# Patient Record
Sex: Female | Born: 1940 | Race: White | Hispanic: No | Marital: Married | Smoking: Never smoker
Health system: Southern US, Community
[De-identification: ages and names within clinical notes are randomized; demographics above are authoritative.]

---

## 1998-05-20 ENCOUNTER — Other Ambulatory Visit: Admission: RE | Admit: 1998-05-20 | Discharge: 1998-05-20 | Payer: Self-pay | Admitting: Obstetrics and Gynecology

## 2002-01-24 ENCOUNTER — Other Ambulatory Visit: Admission: RE | Admit: 2002-01-24 | Discharge: 2002-01-24 | Payer: Self-pay | Admitting: Obstetrics and Gynecology

## 2003-04-12 ENCOUNTER — Other Ambulatory Visit: Admission: RE | Admit: 2003-04-12 | Discharge: 2003-04-12 | Payer: Self-pay | Admitting: Obstetrics and Gynecology

## 2006-08-04 ENCOUNTER — Emergency Department (HOSPITAL_COMMUNITY): Admission: EM | Admit: 2006-08-04 | Discharge: 2006-08-04 | Payer: Self-pay | Admitting: Emergency Medicine

## 2006-08-16 ENCOUNTER — Encounter: Admission: RE | Admit: 2006-08-16 | Discharge: 2006-11-14 | Payer: Self-pay | Admitting: Orthopedic Surgery

## 2006-11-15 ENCOUNTER — Encounter: Admission: RE | Admit: 2006-11-15 | Discharge: 2006-12-01 | Payer: Self-pay | Admitting: Orthopedic Surgery

## 2012-05-11 ENCOUNTER — Encounter (HOSPITAL_BASED_OUTPATIENT_CLINIC_OR_DEPARTMENT_OTHER): Payer: Self-pay | Admitting: *Deleted

## 2012-05-11 ENCOUNTER — Emergency Department (HOSPITAL_BASED_OUTPATIENT_CLINIC_OR_DEPARTMENT_OTHER)
Admission: EM | Admit: 2012-05-11 | Discharge: 2012-05-11 | Disposition: A | Discharge status: Home / Self Care | Payer: Medicare Other | Attending: Emergency Medicine | Admitting: Emergency Medicine

## 2012-05-11 ENCOUNTER — Emergency Department (HOSPITAL_BASED_OUTPATIENT_CLINIC_OR_DEPARTMENT_OTHER): Payer: Medicare Other

## 2012-05-11 DIAGNOSIS — W010XXA Fall on same level from slipping, tripping and stumbling without subsequent striking against object, initial encounter: Secondary | ICD-10-CM | POA: Insufficient documentation

## 2012-05-11 DIAGNOSIS — S0181XA Laceration without foreign body of other part of head, initial encounter: Secondary | ICD-10-CM

## 2012-05-11 DIAGNOSIS — S5001XA Contusion of right elbow, initial encounter: Secondary | ICD-10-CM

## 2012-05-11 DIAGNOSIS — S5000XA Contusion of unspecified elbow, initial encounter: Secondary | ICD-10-CM | POA: Insufficient documentation

## 2012-05-11 DIAGNOSIS — S0180XA Unspecified open wound of other part of head, initial encounter: Secondary | ICD-10-CM | POA: Insufficient documentation

## 2012-05-11 MED ORDER — HYDROCODONE-ACETAMINOPHEN 5-325 MG PO TABS: 2.0000 | ORAL_TABLET | ORAL | Status: AC | PRN

## 2012-05-11 NOTE — ED Provider Notes (Signed)
Medical screening examination/treatment/procedure(s) were performed by non-physician practitioner and as supervising physician I was immediately available for consultation/collaboration.   Forbes Cellar, MD 05/11/12 2352

## 2012-05-11 NOTE — ED Notes (Signed)
Hit the corner of a table with her head. No loc. Laceration noted to the right side of her temple. Bleeding controlled.

## 2012-05-11 NOTE — ED Provider Notes (Signed)
History     CSN: [id number]  Arrival date & time 05/11/12  2055   None     Chief Complaint  Patient presents with  . Head Laceration    (Consider location/radiation/quality/duration/timing/severity/associated sxs/prior treatment) Patient is a 71 y.o. female presenting with fall. The history is provided by the patient. No language interpreter was used.  Fall The accident occurred less than 1 hour ago. The fall occurred while standing (pt tripped on a door step and fell hitting her head on a table). She fell from a height of 3 to 5 ft. She landed on a hard floor. The volume of blood lost was minimal. The point of impact was the head and right elbow. The pain is moderate. She was ambulatory at the scene. There was no entrapment after the fall. There was no drug use involved in the accident. There was no alcohol use involved in the accident. She has tried nothing for the symptoms.  Pt denies neck or back pain.  Pt has a cut to the right side of her face along hailine  History reviewed. No pertinent past medical history.  History reviewed. No pertinent past surgical history.  No family history on file.  History  Substance Use Topics  . Smoking status: Never Smoker   . Smokeless tobacco: Not on file  . Alcohol Use: No    OB History    Grav Para Term Preterm Abortions TAB SAB Ect Mult Living                  Review of Systems  All other systems reviewed and are negative.    Allergies  Review of patient's allergies indicates not on file.  Home Medications  No current outpatient prescriptions on file.  BP 143/84  Pulse 87  Temp 98.1 F (36.7 C) (Oral)  Resp 20  SpO2 98%  Physical Exam  Vitals reviewed. Constitutional: She is oriented to person, place, and time. She appears well-developed and well-nourished.  HENT:  Head: Normocephalic.       3 cm laceration right lateral forehead  Eyes: EOM are normal. Pupils are equal, round, and reactive to light.  Neck: Normal  range of motion. Neck supple.  Cardiovascular: Normal rate.   Pulmonary/Chest: Effort normal.  Abdominal: Soft.  Musculoskeletal: Normal range of motion.  Neurological: She is alert and oriented to person, place, and time. She has normal reflexes. No cranial nerve deficit. Coordination normal.  Skin:       Laceration right face  Psychiatric: She has a normal mood and affect.    ED Course  LACERATION REPAIR Date/Time: 05/11/2012 10:03 PM Performed by: Cheron Schaumann K Authorized by: Cheron Schaumann K Risks and benefits: risks, benefits and alternatives were discussed Consent given by: patient Patient identity confirmed: verbally with patient Body area: head/neck Laceration length: 3 cm Foreign bodies: no foreign bodies Tendon involvement: none Nerve involvement: none Anesthesia: local infiltration Local anesthetic: lidocaine 2% without epinephrine Patient sedated: no Preparation: Patient was prepped and draped in the usual sterile fashion. Irrigation solution: saline Amount of cleaning: standard Debridement: none Skin closure: 6-0 Prolene Subcutaneous closure: 5-0 Vicryl Number of sutures: 10 Technique: simple Approximation: close Approximation difficulty: complex Patient tolerance: Patient tolerated the procedure well with no immediate complications.   (including critical care time)  Labs Reviewed - No data to display No results found.   1. Laceration of forehead   2. Contusion of right elbow       MDM  rx for hydrocodone Pt advised suture removal in 7 days      Lonia Skinner [place], [place] 05/11/12 2231

## 2013-01-12 ENCOUNTER — Emergency Department (HOSPITAL_BASED_OUTPATIENT_CLINIC_OR_DEPARTMENT_OTHER)
Admission: EM | Admit: 2013-01-12 | Discharge: 2013-01-12 | Disposition: A | Discharge status: Home / Self Care | Payer: Medicare Other | Attending: Emergency Medicine | Admitting: Emergency Medicine

## 2013-01-12 ENCOUNTER — Encounter (HOSPITAL_BASED_OUTPATIENT_CLINIC_OR_DEPARTMENT_OTHER): Payer: Self-pay | Admitting: *Deleted

## 2013-01-12 ENCOUNTER — Emergency Department (HOSPITAL_BASED_OUTPATIENT_CLINIC_OR_DEPARTMENT_OTHER): Payer: Medicare Other

## 2013-01-12 DIAGNOSIS — Y939 Activity, unspecified: Secondary | ICD-10-CM | POA: Insufficient documentation

## 2013-01-12 DIAGNOSIS — Z79899 Other long term (current) drug therapy: Secondary | ICD-10-CM | POA: Insufficient documentation

## 2013-01-12 DIAGNOSIS — W1809XA Striking against other object with subsequent fall, initial encounter: Secondary | ICD-10-CM | POA: Insufficient documentation

## 2013-01-12 DIAGNOSIS — S0100XA Unspecified open wound of scalp, initial encounter: Secondary | ICD-10-CM | POA: Insufficient documentation

## 2013-01-12 DIAGNOSIS — Z23 Encounter for immunization: Secondary | ICD-10-CM | POA: Insufficient documentation

## 2013-01-12 DIAGNOSIS — S0990XA Unspecified injury of head, initial encounter: Secondary | ICD-10-CM | POA: Insufficient documentation

## 2013-01-12 DIAGNOSIS — R42 Dizziness and giddiness: Secondary | ICD-10-CM | POA: Insufficient documentation

## 2013-01-12 DIAGNOSIS — Y929 Unspecified place or not applicable: Secondary | ICD-10-CM | POA: Insufficient documentation

## 2013-01-12 DIAGNOSIS — R55 Syncope and collapse: Secondary | ICD-10-CM | POA: Insufficient documentation

## 2013-01-12 DIAGNOSIS — N390 Urinary tract infection, site not specified: Secondary | ICD-10-CM | POA: Insufficient documentation

## 2013-01-12 LAB — CBC WITH DIFFERENTIAL/PLATELET
Lymphocytes Relative: 40 % (ref 12–46)
Lymphs Abs: 2.1 10*3/uL (ref 0.7–4.0)
MCV: 94 fL (ref 78.0–100.0)
Neutrophils Relative %: 49 % (ref 43–77)
Platelets: 223 10*3/uL (ref 150–400)
RBC: 4.01 MIL/uL (ref 3.87–5.11)
WBC: 5.2 10*3/uL (ref 4.0–10.5)

## 2013-01-12 LAB — URINALYSIS, ROUTINE W REFLEX MICROSCOPIC
Glucose, UA: NEGATIVE mg/dL
Hgb urine dipstick: NEGATIVE
Specific Gravity, Urine: 1.009 (ref 1.005–1.030)

## 2013-01-12 LAB — URINE MICROSCOPIC-ADD ON

## 2013-01-12 LAB — BASIC METABOLIC PANEL
CO2: 27 mEq/L (ref 19–32)
Glucose, Bld: 110 mg/dL — ABNORMAL HIGH (ref 70–99)
Potassium: 3.7 mEq/L (ref 3.5–5.1)
Sodium: 142 mEq/L (ref 135–145)

## 2013-01-12 MED ORDER — LIDOCAINE-EPINEPHRINE-TETRACAINE (LET) SOLUTION
NASAL | Status: AC
  Administered 2013-01-12: 3 mL
  Filled 2013-01-12: qty 3

## 2013-01-12 MED ORDER — TETANUS-DIPHTH-ACELL PERTUSSIS 5-2.5-18.5 LF-MCG/0.5 IM SUSP
0.5000 mL | Freq: Once | INTRAMUSCULAR | Status: AC
  Administered 2013-01-12: 0.5 mL via INTRAMUSCULAR
  Filled 2013-01-12: qty 0.5

## 2013-01-12 MED ORDER — CIPROFLOXACIN HCL 500 MG PO TABS: 500.0000 mg | ORAL_TABLET | Freq: Two times a day (BID) | ORAL | Status: DC

## 2013-01-12 MED ORDER — LIDOCAINE-EPINEPHRINE-TETRACAINE (LET) SOLUTION
3.0000 mL | Freq: Once | NASAL | Status: AC
  Administered 2013-01-12: 3 mL via TOPICAL
  Filled 2013-01-12: qty 3

## 2013-01-12 MED ORDER — CIPROFLOXACIN HCL 500 MG PO TABS
500.0000 mg | ORAL_TABLET | Freq: Once | ORAL | Status: AC
  Administered 2013-01-12: 500 mg via ORAL
  Filled 2013-01-12: qty 1

## 2013-01-12 NOTE — ED Notes (Signed)
Patient transported to CT 

## 2013-01-12 NOTE — ED Notes (Signed)
Pt was getting up out of the bed and pt says she became off balance and hit her head on a marble top night stand. Lac to the the back of her head Pt husband reports she had LOC for about 5 seconds.

## 2013-01-12 NOTE — ED Provider Notes (Signed)
History     CSN: [id number]  Arrival date & time 01/12/13  1933   First MD Initiated Contact with Patient 01/12/13 1946      Chief Complaint  Patient presents with  . Fall    (Consider location/radiation/quality/duration/timing/severity/associated sxs/prior treatment) HPI Comments: Patient presents with a head laceration after sustaining a fall. She states that she had been outside and she came in and leg on the bed and she jumped up suddenly and when she did that she got lightheaded and off balance and fell backward into the nightstand where she hit her head. She denies loss of consciousness however her husband who is with her states that she did have a loss of consciousness for a few seconds. He states that since that time she's been a little bit off balance. She denies any speech difficulties. She denies any numbness or weakness in her extremities. She denies feeling dizzy all right now. She states that she was feeling fine earlier in the day. She denies any recent illnesses, chest pain, shortness of breath or recent fevers. She denies any UTI symptoms. She does state that she drink 1 glass of wine this evening. She denies being on any anticoagulants. She has a constant throbbing pain to the back of her head where her laceration is located.  Patient is a 72 y.o. female presenting with fall.  Fall Pertinent negatives include no fever, no numbness, no abdominal pain, no nausea, no vomiting, no hematuria and no headaches.    History reviewed. No pertinent past medical history.  History reviewed. No pertinent past surgical history.  No family history on file.  History  Substance Use Topics  . Smoking status: Never Smoker   . Smokeless tobacco: Not on file  . Alcohol Use: Yes    OB History   Grav Para Term Preterm Abortions TAB SAB Ect Mult Living                  Review of Systems  Constitutional: Negative for fever, chills, diaphoresis and fatigue.  HENT: Negative for  congestion, rhinorrhea and sneezing.   Eyes: Negative.   Respiratory: Negative for cough, chest tightness and shortness of breath.   Cardiovascular: Negative for chest pain and leg swelling.  Gastrointestinal: Negative for nausea, vomiting, abdominal pain, diarrhea and blood in stool.  Genitourinary: Negative for frequency, hematuria, flank pain and difficulty urinating.  Musculoskeletal: Negative for back pain and arthralgias.  Skin: Positive for wound. Negative for rash.  Neurological: Positive for dizziness and syncope. Negative for speech difficulty, weakness, numbness and headaches.    Allergies  Review of patient's allergies indicates no known allergies.  Home Medications   Current Outpatient Rx  Name  Route  Sig  Dispense  Refill  . Ascorbic Acid (VITAMIN C PO)   Oral   Take 1 tablet by mouth daily.         . Multiple Vitamin (MULTIVITAMIN WITH MINERALS) TABS   Oral   Take 1 tablet by mouth daily.         . Omega-3 Fatty Acids (FISH OIL PO)   Oral   Take 1 capsule by mouth daily.         . ciprofloxacin (CIPRO) 500 MG tablet   Oral   Take 1 tablet (500 mg total) by mouth 2 (two) times daily. One po bid x 7 days   14 tablet   0     BP 139/78  Pulse 74  Temp(Src) 97.6 F (36.4 C) (  Oral)  Resp 18  Ht 5\' 4"  (1.626 m)  Wt 185 lb (83.915 kg)  BMI 31.74 kg/m2  SpO2 98%  Physical Exam  Constitutional: She is oriented to person, place, and time. She appears well-developed and well-nourished.  HENT:  Head: Normocephalic and atraumatic.  Is a 3 cm linear laceration to the posterior right scalp. There is no active bleeding. There is a small underlying hematoma.  Eyes: Pupils are equal, round, and reactive to light.  Neck: Normal range of motion. Neck supple.  There is no pain to the neck or back.  Cardiovascular: Normal rate, regular rhythm and normal heart sounds.   Pulmonary/Chest: Effort normal and breath sounds normal. No respiratory distress. She has no  wheezes. She has no rales. She exhibits no tenderness.  Abdominal: Soft. Bowel sounds are normal. There is no tenderness. There is no rebound and no guarding.  Musculoskeletal: Normal range of motion. She exhibits no edema.  No pain on palpation or range of motion extremities  Lymphadenopathy:    She has no cervical adenopathy.  Neurological: She is alert and oriented to person, place, and time. She has normal strength. No cranial nerve deficit or sensory deficit. GCS eye subscore is 4. GCS verbal subscore is 5. GCS motor subscore is 6.  Finger to nose intact.  Skin: Skin is warm and dry. No rash noted.  Psychiatric: She has a normal mood and affect.    ED Course  LACERATION REPAIR Date/Time: 01/12/2013 9:30 PM Performed by: BELFI, MELANIE Authorized by: Rolan Bucco Consent: Verbal consent obtained. Risks and benefits: risks, benefits and alternatives were discussed Consent given by: patient Patient understanding: patient states understanding of the procedure being performed Patient identity confirmed: verbally with patient Time out: Immediately prior to procedure a "time out" was called to verify the correct patient, procedure, equipment, support staff and site/side marked as required. Body area: head/neck Laceration length: 3 cm Tendon involvement: none Nerve involvement: none Vascular damage: no Anesthesia: local infiltration Local anesthetic: lidocaine 2% with epinephrine Anesthetic total: 2 ml Preparation: Patient was prepped and draped in the usual sterile fashion. Irrigation solution: saline Irrigation method: syringe Amount of cleaning: standard Skin closure: staples Number of sutures: 6 Technique: simple Approximation: close Approximation difficulty: simple Patient tolerance: Patient tolerated the procedure well with no immediate complications. Comments: TDAP updated   (including critical care time)  Results for orders placed during the hospital encounter of  01/12/13  CBC WITH DIFFERENTIAL      Result Value Range   WBC 5.2  4.0 - 10.5 K/uL   RBC 4.01  3.87 - 5.11 MIL/uL   Hemoglobin 13.0  12.0 - 15.0 g/dL   HCT 16.1  09.6 - 04.5 %   MCV 94.0  78.0 - 100.0 fL   MCH 32.4  26.0 - 34.0 pg   MCHC 34.5  30.0 - 36.0 g/dL   RDW 40.9  81.1 - 91.4 %   Platelets 223  150 - 400 K/uL   Neutrophils Relative 49  43 - 77 %   Neutro Abs 2.5  1.7 - 7.7 K/uL   Lymphocytes Relative 40  12 - 46 %   Lymphs Abs 2.1  0.7 - 4.0 K/uL   Monocytes Relative 7  3 - 12 %   Monocytes Absolute 0.3  0.1 - 1.0 K/uL   Eosinophils Relative 4  0 - 5 %   Eosinophils Absolute 0.2  0.0 - 0.7 K/uL   Basophils Relative 1  0 - 1 %  Basophils Absolute 0.1  0.0 - 0.1 K/uL  BASIC METABOLIC PANEL      Result Value Range   Sodium 142  135 - 145 mEq/L   Potassium 3.7  3.5 - 5.1 mEq/L   Chloride 102  96 - 112 mEq/L   CO2 27  19 - 32 mEq/L   Glucose, Bld 110 (*) 70 - 99 mg/dL   BUN 14  6 - 23 mg/dL   Creatinine, Ser 7.82  0.50 - 1.10 mg/dL   Calcium 9.6  8.4 - 95.6 mg/dL   GFR calc non Af Amer 86 (*) >90 mL/min   GFR calc Af Amer >90  >90 mL/min  URINALYSIS, ROUTINE W REFLEX MICROSCOPIC      Result Value Range   Color, Urine YELLOW  YELLOW   APPearance CLEAR  CLEAR   Specific Gravity, Urine 1.009  1.005 - 1.030   pH 5.5  5.0 - 8.0   Glucose, UA NEGATIVE  NEGATIVE mg/dL   Hgb urine dipstick NEGATIVE  NEGATIVE   Bilirubin Urine NEGATIVE  NEGATIVE   Ketones, ur NEGATIVE  NEGATIVE mg/dL   Protein, ur NEGATIVE  NEGATIVE mg/dL   Urobilinogen, UA 0.2  0.0 - 1.0 mg/dL   Nitrite NEGATIVE  NEGATIVE   Leukocytes, UA LARGE (*) NEGATIVE  URINE MICROSCOPIC-ADD ON      Result Value Range   Squamous Epithelial / LPF FEW (*) RARE   WBC, UA 7-10  <3 WBC/hpf   RBC / HPF 0-2  <3 RBC/hpf   Bacteria, UA FEW (*) RARE   Ct Head Wo Contrast  01/12/2013  *RADIOLOGY REPORT*  Clinical Data: Status post fall; hit back of head, with laceration and loss of consciousness.  CT HEAD WITHOUT CONTRAST   Technique:  Contiguous axial images were obtained from the base of the skull through the vertex without contrast.  Comparison: None.  Findings: There is no evidence of acute infarction, mass lesion, or intra- or extra-axial hemorrhage on CT.  Minimal periventricular white matter change likely reflects small vessel ischemic microangiopathy.  The posterior fossa, including the cerebellum, brainstem and fourth ventricle, is within normal limits.  The third and lateral ventricles, and basal ganglia are unremarkable in appearance.  The cerebral hemispheres are symmetric in appearance, with normal gray- white differentiation.  No mass effect or midline shift is seen.  There is no evidence of fracture; visualized osseous structures are unremarkable in appearance.  The orbits are within normal limits. Mildly complex fluid is noted within the right maxillary sinus, without definite associated fracture.  Remaining paranasal sinuses and mastoid air cells are well-aerated.  Mild soft tissue disruption is noted on the right side at the vertex.  IMPRESSION:  1.  No evidence of traumatic intracranial injury or fracture. 2.  Mild soft tissue disruption noted on the right side at the vertex. 3.  Mildly complex fluid within the right maxillary sinus, without evidence of associated fracture. 4.  Mild small vessel ischemic microangiopathy.   Original Report Authenticated By: Tonia Ghent, M.D.     No results found.   Date: 01/12/2013  Rate: 67  Rhythm: normal sinus rhythm  QRS Axis: normal  Intervals: normal  ST/T Wave abnormalities: normal  Conduction Disutrbances:none  Narrative Interpretation:   Old EKG Reviewed: none available   1. Laceration of head, initial encounter   2. Head injury, initial encounter   3. UTI (lower urinary tract infection)       MDM  Patient is well-appearing. Her laceration was  repaired and she ambulated in the ED without problem. She has no symptoms suggestive of TIA/CVA. There is  no evidence of intracranial hemorrhage. She has nothing to suggest acute coronary syndrome. She does have evidence of a urinary tract infection which could have contributed to the sudden dizziness on standing however I feel like it was most likely a vasovagal type event. Her husband states that she stood up very fast. She's had no further episodes of dizziness. Mikael Spray return here in 10 days for staple removal. Advised her to followup with her primary care physician on Monday for recheck or return here she has any worsening symptoms over the weekend.        Rolan Bucco, MD 01/12/13 2210

## 2013-01-14 LAB — URINE CULTURE

## 2013-07-10 IMAGING — CR DG ELBOW COMPLETE 3+V*R*
4 series · 4 of 4 positions shown · non-contrast
Comparison: None.

CLINICAL DATA: Fall, pain, abrasions.

RIGHT ELBOW - COMPLETE 3+ VIEW

[x elbow joint ap right]
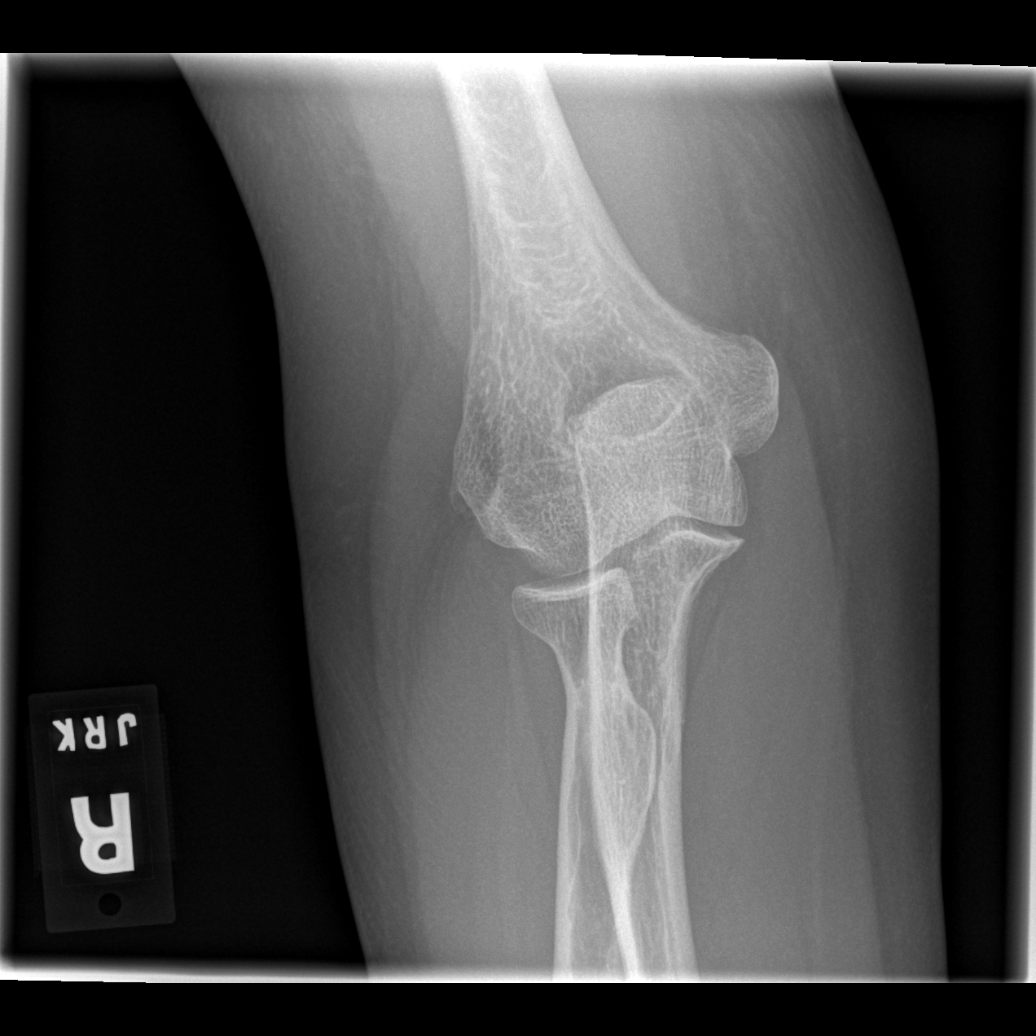

[x elbow joint obl. right (1 of 2)]
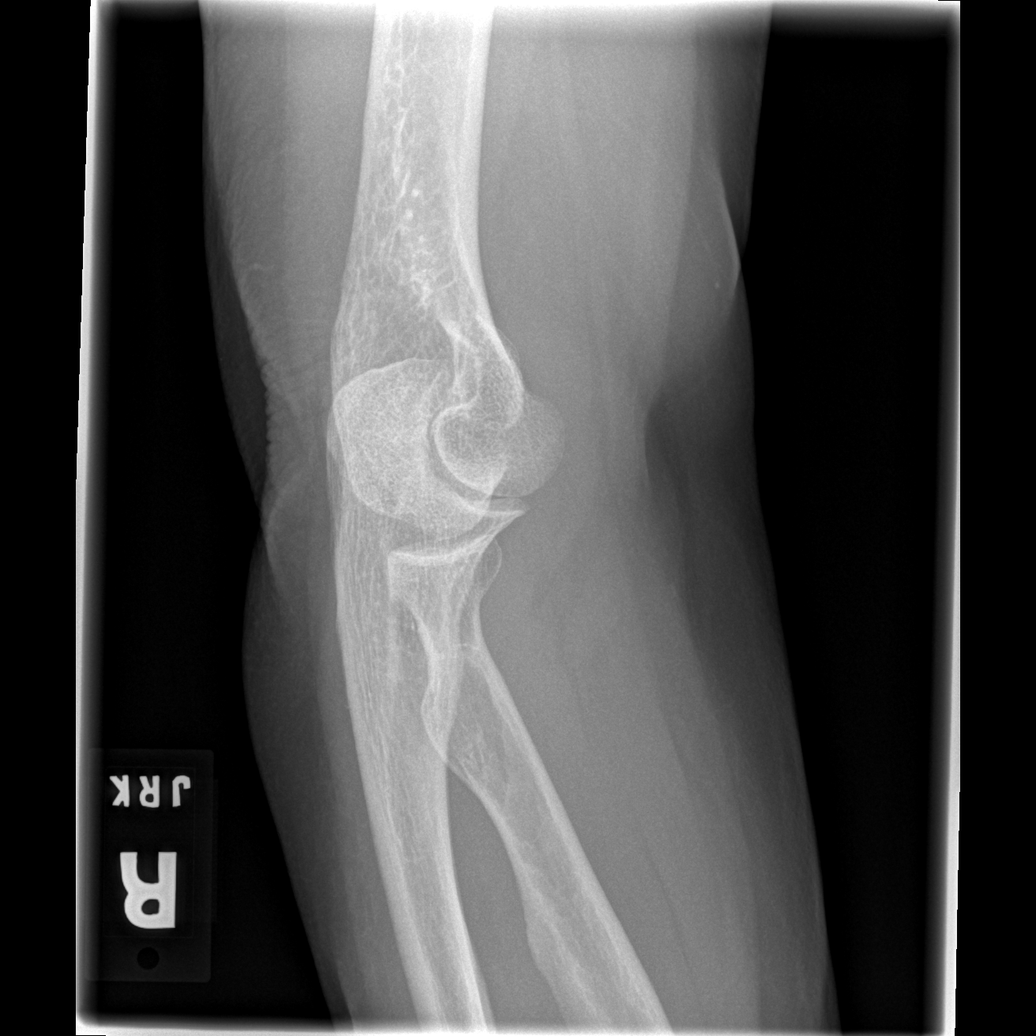

[x elbow joint obl. right (2 of 2)]
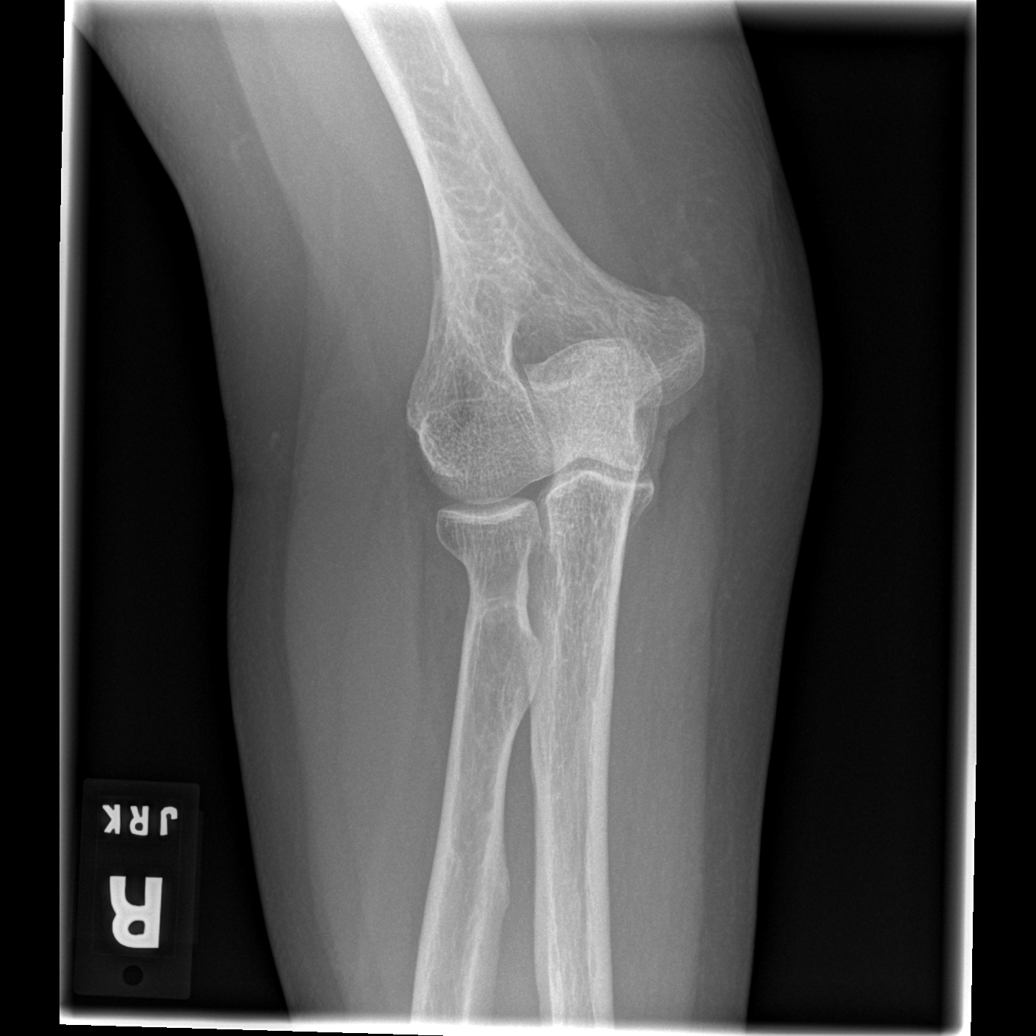

[x elbow joint lat right]
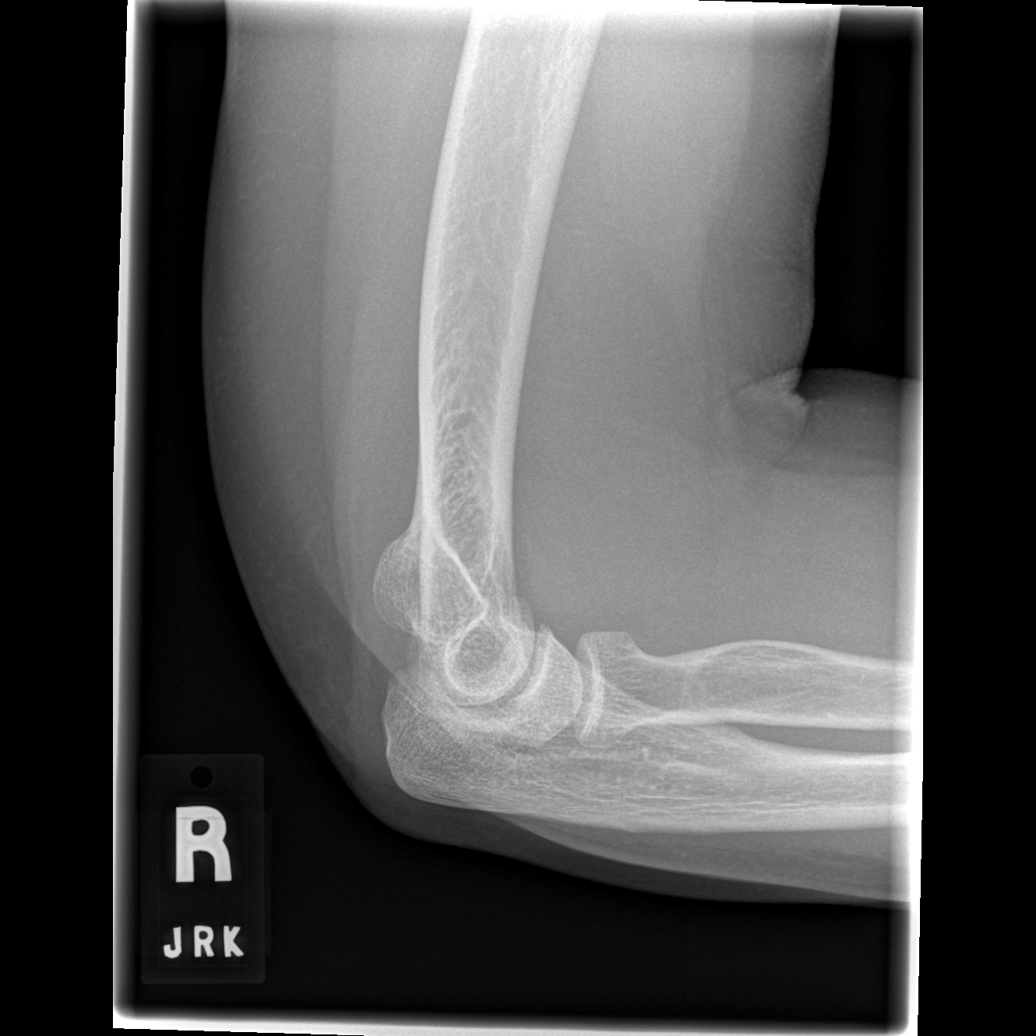

[4 of 4 positions shown; findings below may reference images not displayed]

FINDINGS: No acute bony abnormality.  Specifically, no fracture,
subluxation, or dislocation.  Soft tissues are intact.  No joint
effusion.  Joint spaces are maintained.
IMPRESSION: No acute bony abnormality.

## 2014-03-13 IMAGING — CT CT HEAD W/O CM
2 series · 16 of 30 positions shown, 18 images · non-contrast
Comparison: None.

CLINICAL DATA: Status post fall; hit back of head, with laceration
and loss of consciousness.

CT HEAD WITHOUT CONTRAST
TECHNIQUE: Contiguous axial images were obtained from the base of
the skull through the vertex without contrast.

[Series 2: head 4.8 h37s · axial · 0.46mm/px · z∈[-122,-5]mm · 8 of 32 slices shown, 10 images]
[im 4/32  brain]
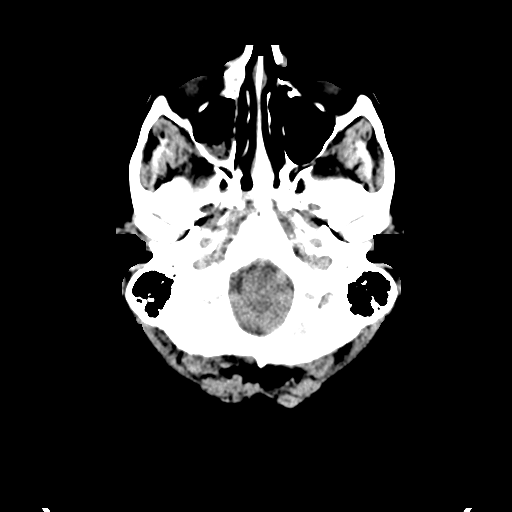
[im 4/32  bone]
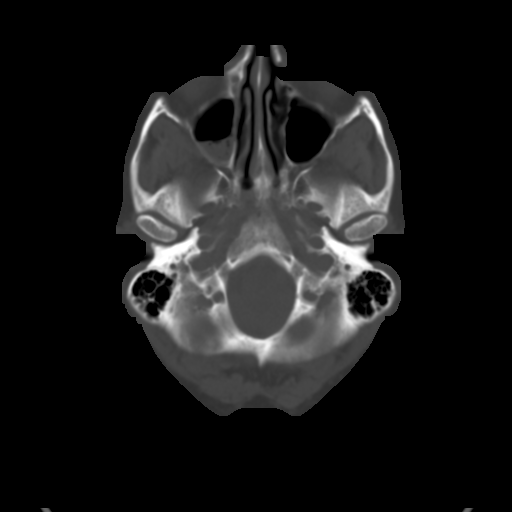
[im 7/32  brain]
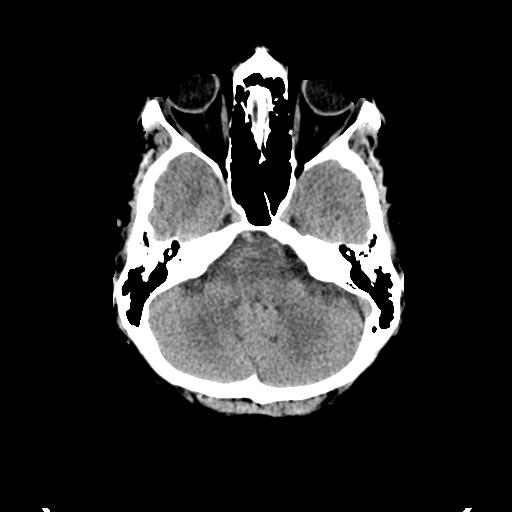
[im 11/32  brain]
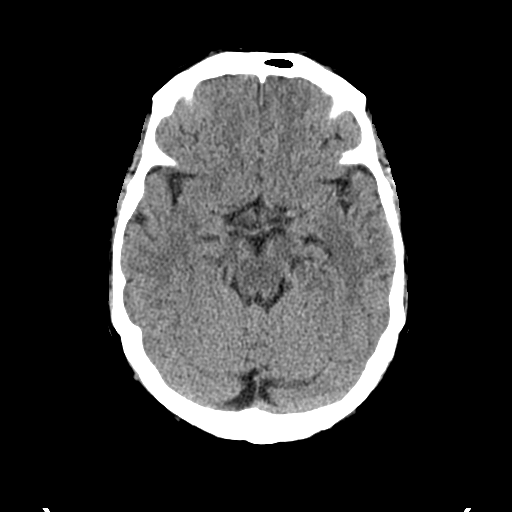
[im 14/32  brain]
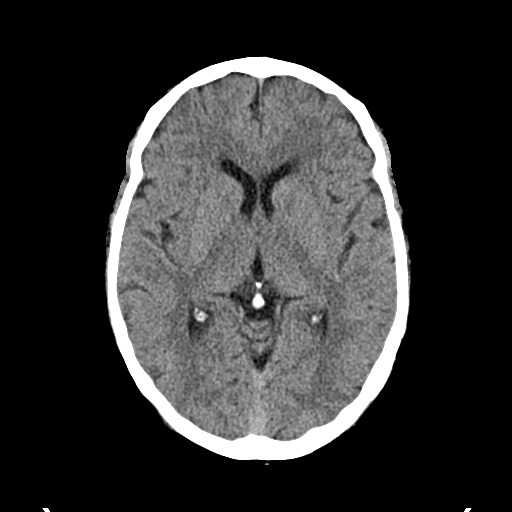
[im 18/32  brain]
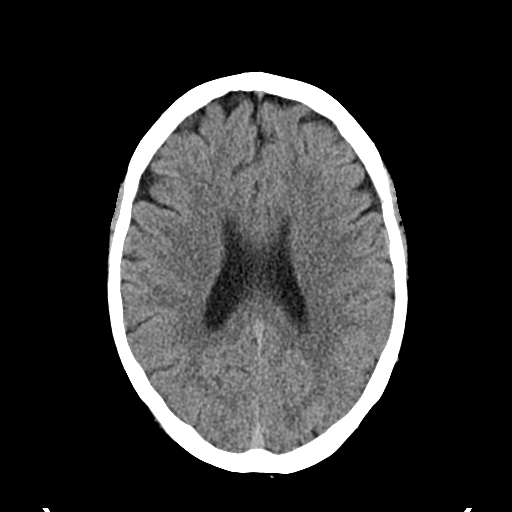
[im 18/32  bone]
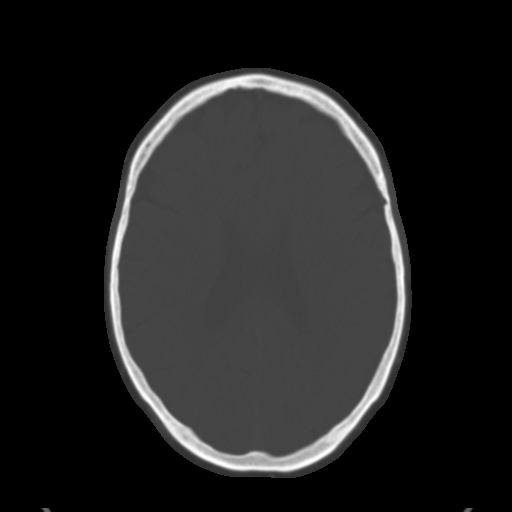
[im 21/32  brain]
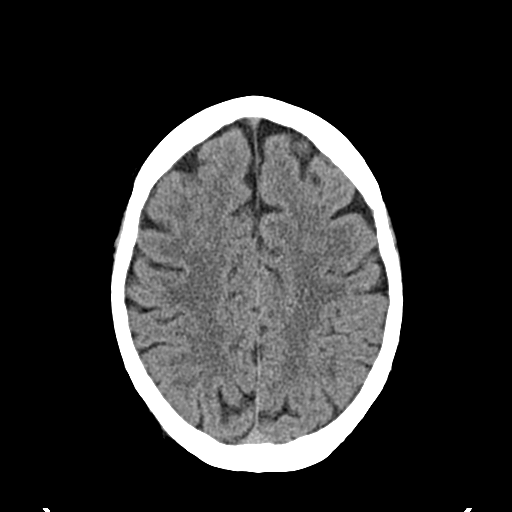
[im 25/32  brain]
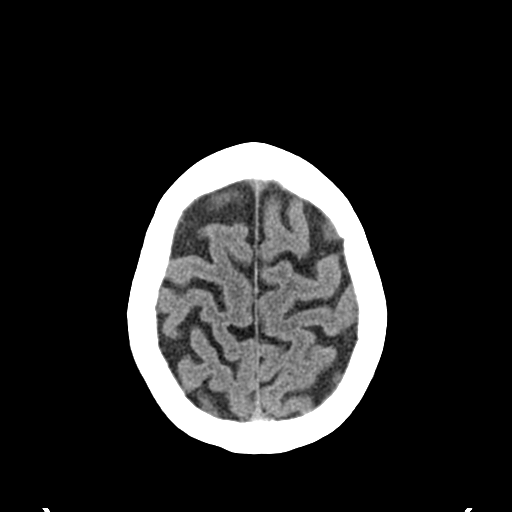
[im 28/32  brain]
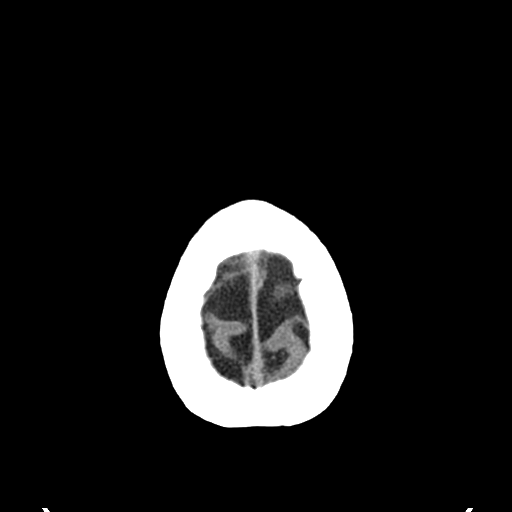

[Series 3: head 2.4 h60s bone · axial · 0.46mm/px · z∈[-124,-2]mm · 8 of 64 slices shown]
[im 7/64  bone]
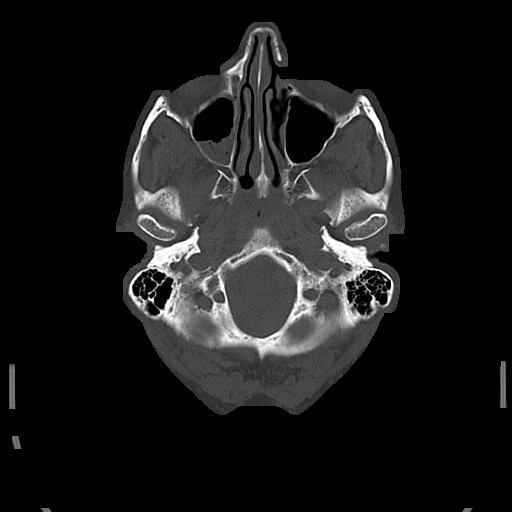
[im 14/64  bone]
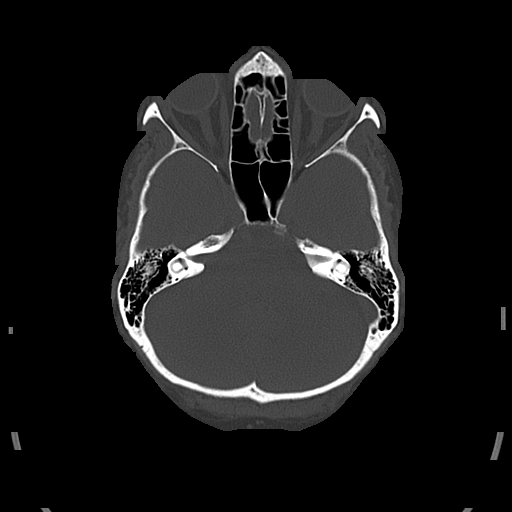
[im 20/64  bone]
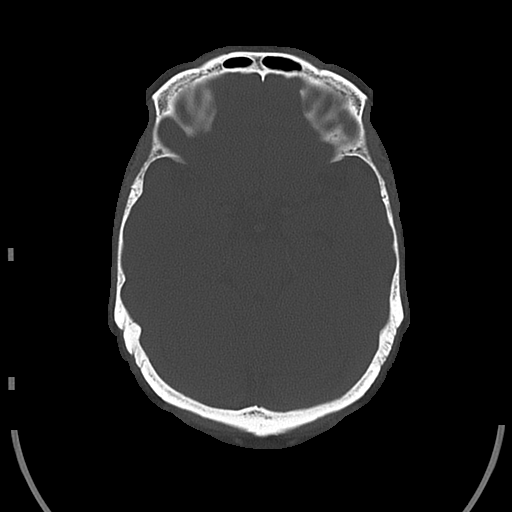
[im 27/64  bone]
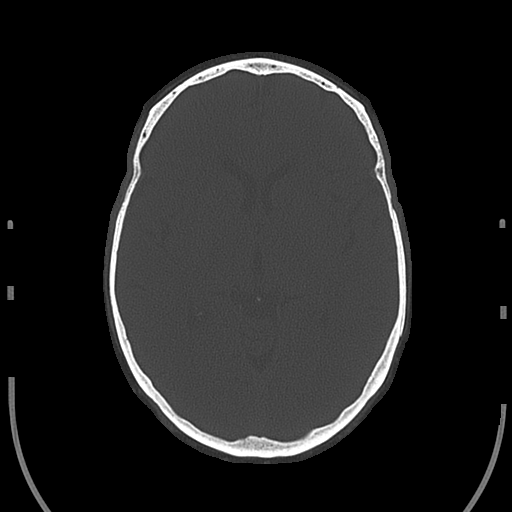
[im 37/64  bone]
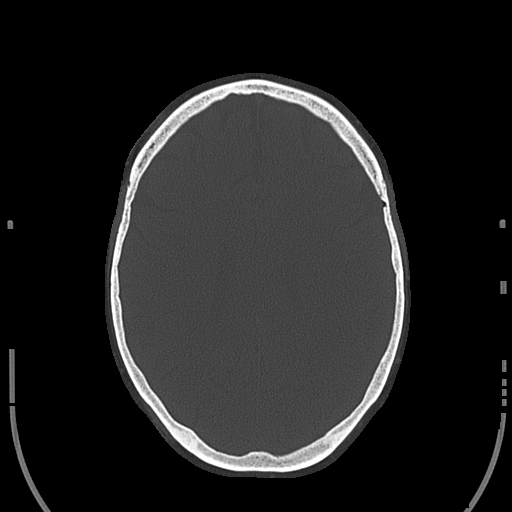
[im 44/64  bone]
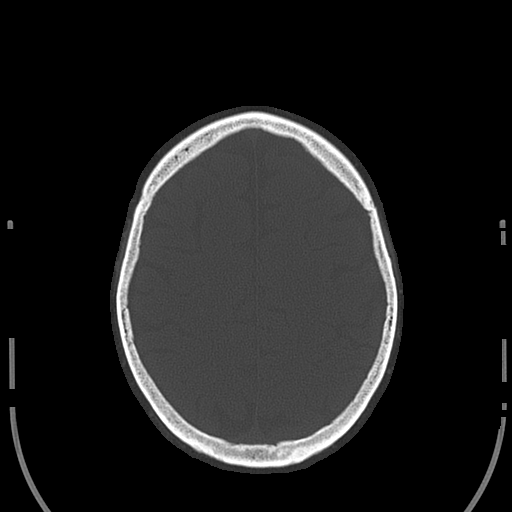
[im 50/64  bone]
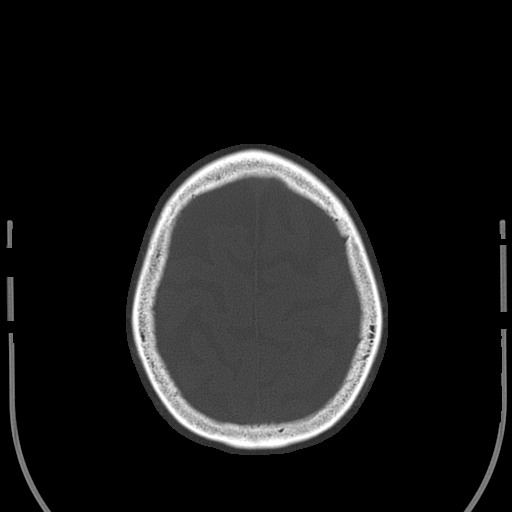
[im 57/64  bone]
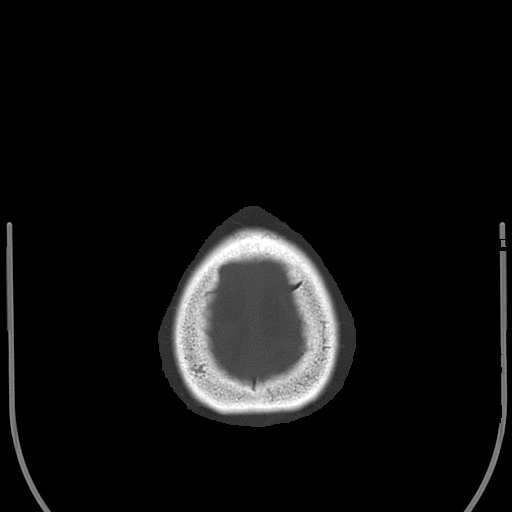

[16 of 30 positions shown; findings below may reference images not displayed]

FINDINGS: There is no evidence of acute infarction, mass lesion, or
intra- or extra-axial hemorrhage on CT.

Minimal periventricular white matter change likely reflects small
vessel ischemic microangiopathy.

The posterior fossa, including the cerebellum, brainstem and fourth
ventricle, is within normal limits.  The third and lateral
ventricles, and basal ganglia are unremarkable in appearance.  The
cerebral hemispheres are symmetric in appearance, with normal gray-
white differentiation.  No mass effect or midline shift is seen.

There is no evidence of fracture; visualized osseous structures are
unremarkable in appearance.  The orbits are within normal limits.
Mildly complex fluid is noted within the right maxillary sinus,
without definite associated fracture.  Remaining paranasal sinuses
and mastoid air cells are well-aerated.  Mild soft tissue
disruption is noted on the right side at the vertex.
IMPRESSION: 1.  No evidence of traumatic intracranial injury or fracture.
2.  Mild soft tissue disruption noted on the right side at the
vertex.
3.  Mildly complex fluid within the right maxillary sinus, without
evidence of associated fracture.
4.  Mild small vessel ischemic microangiopathy.

## 2015-04-25 DIAGNOSIS — K635 Polyp of colon: Secondary | ICD-10-CM | POA: Insufficient documentation

## 2017-01-19 DIAGNOSIS — H608X3 Other otitis externa, bilateral: Secondary | ICD-10-CM | POA: Insufficient documentation

## 2017-01-19 DIAGNOSIS — J302 Other seasonal allergic rhinitis: Secondary | ICD-10-CM | POA: Insufficient documentation

## 2019-08-07 ENCOUNTER — Other Ambulatory Visit: Payer: Self-pay

## 2019-08-07 DIAGNOSIS — Z20822 Contact with and (suspected) exposure to covid-19: Secondary | ICD-10-CM

## 2019-08-08 ENCOUNTER — Telehealth: Payer: Self-pay | Admitting: General Practice

## 2019-08-08 LAB — NOVEL CORONAVIRUS, NAA: SARS-CoV-2, NAA: NOT DETECTED

## 2019-08-08 NOTE — Telephone Encounter (Signed)
Negative COVID results given. Patient results "NOT Detected." Caller expressed understanding. ° °

## 2019-10-29 ENCOUNTER — Encounter: Payer: Self-pay | Admitting: Physician Assistant

## 2019-10-29 ENCOUNTER — Ambulatory Visit: Payer: Medicare Other | Admitting: Physician Assistant

## 2019-10-29 ENCOUNTER — Ambulatory Visit: Payer: Self-pay

## 2019-10-29 ENCOUNTER — Other Ambulatory Visit: Payer: Self-pay

## 2019-10-29 VITALS — Ht 64.0 in | Wt 190.0 lb

## 2019-10-29 DIAGNOSIS — M1711 Unilateral primary osteoarthritis, right knee: Secondary | ICD-10-CM

## 2019-10-29 NOTE — Progress Notes (Signed)
   Office Visit Note   Patient: Brenda Lawson           Date of Birth: 14-May-1941           MRN: [id number] Visit Date: 10/29/2019              Requested by: No referring provider defined for this encounter. PCP: Mayra Neer, MD   Assessment & Plan: Visit Diagnoses:  1. Primary osteoarthritis of right knee     Plan: We will send her to formal physical therapy for strengthening home exercise and modalities to the right knee.  Discussed knee friendly exercises with her.  Like to see her back in 4 weeks see what type of response she had to the injection and to therapy.  Also will have her apply Voltaren gel up to 4 g 4 times daily to the knee.  She will also continue taking turmeric which she recently started.  Follow-Up Instructions: Return in about 4 weeks (around 11/26/2019).   Orders:  Orders Placed This Encounter  Procedures  . XR KNEE 3 VIEW RIGHT   No orders of the defined types were placed in this encounter.     Procedures: No procedures performed   Clinical Data: No additional findings.   Subjective: Chief Complaint  Patient presents with  . Right Knee - Pain    HPI [name] is a pleasant 78 year old female were seen for the first time for right knee pain for the last 3 weeks.  She has had no particular injury to the knee.  She was putting up some Christmas decorations and is unsure if she hurt the knee at that time.  Most her pain is posterior lateral aspect of the knee.  She does note some swelling.  She is taken some Tylenol with some minimal relief.  No mechanical symptoms of the knee. Review of Systems Negative for fevers chills shortness of breath chest pain  Objective: Vital Signs: Ht 5\' 4"  (1.626 m)   Wt 190 lb (86.2 kg)   BMI 32.61 kg/m   Physical Exam Constitutional:      Appearance: She is not ill-appearing or diaphoretic.  Neurological:     Mental Status: She is alert and oriented to person, place, and time.  Psychiatric:        Mood  and Affect: Mood normal.     Ortho Exam Bilateral knees good range of motion of both knees.  No instability valgus varus stressing of either knee.  Bilateral knees without rashes skin lesions ulcerations or erythema.  Left knee no effusion.  Right knee edema versus effusion. Specialty Comments:  No specialty comments available.  Imaging: XR KNEE 3 VIEW RIGHT  Result Date: 10/29/2019 Right knee 3 views: No acute fracture.  Knee is well located.  Lateral joint line moderate narrowing.  Mild to moderate patellofemoral changes.    PMFS History: Patient Active Problem List   Diagnosis Date Noted  . Primary osteoarthritis of right knee 10/29/2019   History reviewed. No pertinent past medical history.  History reviewed. No pertinent family history.  History reviewed. No pertinent surgical history. Social History   Occupational History  . Not on file  Tobacco Use  . Smoking status: Never Smoker  . Smokeless tobacco: Never Used  Substance and Sexual Activity  . Alcohol use: Yes  . Drug use: No  . Sexual activity: Not on file

## 2019-11-26 ENCOUNTER — Ambulatory Visit: Payer: Medicare Other | Admitting: Physician Assistant

## 2019-11-29 ENCOUNTER — Encounter: Payer: Self-pay | Admitting: Physician Assistant

## 2019-11-29 ENCOUNTER — Ambulatory Visit: Payer: Medicare Other | Admitting: Physician Assistant

## 2019-11-29 ENCOUNTER — Other Ambulatory Visit: Payer: Self-pay

## 2019-11-29 DIAGNOSIS — M1711 Unilateral primary osteoarthritis, right knee: Secondary | ICD-10-CM | POA: Diagnosis not present

## 2019-11-29 NOTE — Progress Notes (Signed)
   Office Visit Note   Patient: Brenda Lawson           Date of Birth: [birth year]           MRN: [id number] Visit Date: 11/29/2019              Requested by: Lupita Raider, MD [address] [place],  [place] [address] PCP: Lupita Raider, MD   Assessment & Plan: Visit Diagnoses:  1. Primary osteoarthritis of right knee     Plan: Continue to work on range of motion strengthening of both knees.  She will continue her Voltaren gel and turmeric.  She understands to wait least 3 months between injections.  If the injection does not last close to 3 months then will recommend a supplemental injection this would have to be preapproved and she understands this.  She should follow-up with [place] on a as needed basis.  Questions encouraged and answered  Follow-Up Instructions: Return if symptoms worsen or fail to improve.   Orders:  No orders of the defined types were placed in this encounter.  No orders of the defined types were placed in this encounter.     Procedures: No procedures performed   Clinical Data: No additional findings.   Subjective: Chief Complaint  Patient presents with  . Right Knee - Follow-up    HPI Ms. Gunnarson returns today 4 weeks status post right knee injection.  She states the injection was very helpful.  She is been going to physical therapy finds this helpful.  She is wearing compression hose.  Her only complaint is going down steps in the knee feels like it may give way.  She is working with therapy on that training.  She is also taking turmeric and using Voltaren gel on the knee and she finds both of these to be beneficial. Review of Systems Negative for fevers or chills  Objective: Vital Signs: There were no vitals taken for this visit.  Physical Exam Constitutional:      Appearance: She is not ill-appearing or diaphoretic.  Pulmonary:     Effort: Pulmonary effort is normal.  Neurological:     Mental Status: She is alert.    Psychiatric:        Mood and Affect: Mood normal.     Ortho Exam Right knee full extension full flexion.  Significant patellofemoral crepitus with passive range of motion.  No abnormal warmth erythema Specialty Comments:  No specialty comments available.  Imaging: No results found.   PMFS History: Patient Active Problem List   Diagnosis Date Noted  . Primary osteoarthritis of right knee 10/29/2019   History reviewed. No pertinent past medical history.  History reviewed. No pertinent family history.  History reviewed. No pertinent surgical history. Social History   Occupational History  . Not on file  Tobacco Use  . Smoking status: Never Smoker  . Smokeless tobacco: Never Used  Substance and Sexual Activity  . Alcohol use: Yes  . Drug use: No  . Sexual activity: Not on file

## 2020-09-11 ENCOUNTER — Ambulatory Visit: Payer: Medicare Other | Admitting: Physician Assistant

## 2020-09-17 ENCOUNTER — Encounter: Payer: Self-pay | Admitting: Physician Assistant

## 2020-09-17 ENCOUNTER — Ambulatory Visit: Payer: Medicare Other | Admitting: Physician Assistant

## 2020-09-17 ENCOUNTER — Ambulatory Visit: Payer: Self-pay

## 2020-09-17 DIAGNOSIS — M1711 Unilateral primary osteoarthritis, right knee: Secondary | ICD-10-CM

## 2020-09-17 NOTE — Progress Notes (Signed)
HPI: Brenda Lawson returns today follow-up of her right knee.  We saw her last in January at that time placed a cortisone injection in her right knee.  She did go to physical therapy and is unsure if the cortisone injection or the therapy her for little bit of both helped with her pain.  She is using Voltaren gel on her knees.  She notes most of her pains whenever she is going down the stairs.  Occasionally wears knee brace is unsure if this helps.  No new injury in the knee.  She does take turmeric.  Overall right knee is improved.  Review of systems see HPI otherwise negative noncontributory.  Physical exam: General well-developed well-nourished female no acute distress mood and affect appropriate.  Psych alert and oriented x3 Right knee: Tenderness along lateral joint line no instability valgus varus stressing.  No abnormal warmth erythema or effusion.  Good range of motion of the knee.  Radiographs: Right knee AP lateral views shows: No acute fracture.  Moderate arthritis lateral compartment with osteophytes off the lateral joint line.  Mild to moderate patellofemoral changes.  Medial compartment well-maintained.  No acute findings otherwise.  Impression: Right knee osteoarthritis  Plan: Discussed with her that at this point time due to the fact that her knee pain is improved recommend continuing physical therapy.  She can continue her turmeric also continue Voltaren gel.  Gave her a handout on supplemental injections if she wishes to proceed with this she will call the office so that we can gain approval.  Periodic cortisone injections as needed.  Questions encouraged and answered.

## 2020-11-10 ENCOUNTER — Ambulatory Visit: Payer: Medicare Other | Admitting: Physician Assistant

## 2020-11-10 ENCOUNTER — Ambulatory Visit: Payer: Self-pay

## 2020-11-10 ENCOUNTER — Encounter: Payer: Self-pay | Admitting: Physician Assistant

## 2020-11-10 DIAGNOSIS — M79645 Pain in left finger(s): Secondary | ICD-10-CM | POA: Diagnosis not present

## 2020-11-10 DIAGNOSIS — M1711 Unilateral primary osteoarthritis, right knee: Secondary | ICD-10-CM | POA: Diagnosis not present

## 2020-11-10 DIAGNOSIS — M79644 Pain in right finger(s): Secondary | ICD-10-CM | POA: Diagnosis not present

## 2020-11-10 MED ORDER — LIDOCAINE HCL 1 % IJ SOLN
3.0000 mL | INTRAMUSCULAR | Status: AC | PRN
  Administered 2020-11-10: 3 mL

## 2020-11-10 MED ORDER — METHYLPREDNISOLONE ACETATE 40 MG/ML IJ SUSP
40.0000 mg | INTRAMUSCULAR | Status: AC | PRN
  Administered 2020-11-10: 40 mg via INTRA_ARTICULAR

## 2020-11-10 NOTE — Progress Notes (Signed)
Office Visit Note   Patient: Brenda Lawson           Date of Birth: November 13, 1940           MRN: [id number] Visit Date: 11/10/2020              Requested by: Brenda Raider, MD [address] [place],  [place] [address] PCP: Brenda Raider, MD   Assessment & Plan: Visit Diagnoses:  1. Thumb pain, right   2. Thumb pain, left   3. Primary osteoarthritis of right knee     Plan: She understands to wait at least 3 months between injections in her right knee.  In regards to her bilateral hand pain recommended referral to hand orthopedic surgeon she defers.  She will continue use of Voltaren gel if she continues to have pain and wishes to be referred in the future we can definitely make this referral for her.  Questions were encouraged and answered.  Follow-Up Instructions: Return if symptoms worsen or fail to improve.   Orders:  Orders Placed This Encounter  Procedures  . Large Joint Inj  . XR Finger Thumb Left  . XR Finger Thumb Right   No orders of the defined types were placed in this encounter.     Procedures: Large Joint Inj: R knee on 11/10/2020 4:00 PM Indications: pain Details: 22 G 1.5 in needle, anterolateral approach  Arthrogram: No  Medications: 3 mL lidocaine 1 %; 40 mg methylPREDNISolone acetate 40 MG/ML Outcome: tolerated well, no immediate complications Procedure, treatment alternatives, risks and benefits explained, specific risks discussed. Consent was given by the patient. Immediately prior to procedure a time out was called to verify the correct patient, procedure, equipment, support staff and site/side marked as required. Patient was prepped and draped in the usual sterile fashion.       Clinical Data: No additional findings.   Subjective: Chief Complaint  Patient presents with  . Left Thumb - Pain  . Right Thumb - Pain  . Right Knee - Pain    HPI Brenda Lawson returns today requesting an injection in her right knee she has known  osteoarthritis right knee.  She also is having pain in both of her thumbs that is been present for the past 6 months no known injury.  States the pain in the right thumb is worse than the left.  Review of Systems Negative for fevers or chills.  Objective: Vital Signs: There were no vitals taken for this visit.  Physical Exam Constitutional:      Appearance: She is not ill-appearing or diaphoretic.  Pulmonary:     Effort: Pulmonary effort is normal.  Neurological:     Mental Status: She is alert and oriented to person, place, and time.  Psychiatric:        Mood and Affect: Mood normal.     Ortho Exam Right knee: No abnormal warmth erythema or effusion.  No instability valgus varus stressing.  Significant crepitus with passive range of motion of the right knee. Lateral thumbs she has no pain over the first extensor compartment on either thumb.  Pain is more towards the base of the thumb volar aspect of the hand.  Crepitus right knee with grind test negative grind test on the left.  No significant pain with grind test bilaterally. Specialty Comments:  No specialty comments available.  Imaging: XR Finger Thumb Left  Result Date: 11/10/2020 Left breast: No acute fractures.  CMC joint mild narrowing.  Otherwise no bony values.  XR Finger Thumb Right  Result Date: 11/10/2020 Right wrist 3 views: Significant navicular/trapezium joint narrowing.  CMC joint overall well-preserved.  No acute fractures or other bony abnormalities.    PMFS History: Patient Active Problem List   Diagnosis Date Noted  . Primary osteoarthritis of right knee 10/29/2019  . Chronic eczematous otitis externa of both ears 01/19/2017  . Chronic seasonal allergic rhinitis 01/19/2017  . Colon polyp 04/25/2015   History reviewed. No pertinent past medical history.  History reviewed. No pertinent family history.  History reviewed. No pertinent surgical history. Social History   Occupational History  . Not on  file  Tobacco Use  . Smoking status: Never Smoker  . Smokeless tobacco: Never Used  Substance and Sexual Activity  . Alcohol use: Yes  . Drug use: No  . Sexual activity: Not on file

## 2021-11-25 DIAGNOSIS — H5203 Hypermetropia, bilateral: Secondary | ICD-10-CM | POA: Diagnosis not present

## 2021-11-25 DIAGNOSIS — H524 Presbyopia: Secondary | ICD-10-CM | POA: Diagnosis not present

## 2021-11-25 DIAGNOSIS — H52223 Regular astigmatism, bilateral: Secondary | ICD-10-CM | POA: Diagnosis not present

## 2021-11-30 DIAGNOSIS — Z0100 Encounter for examination of eyes and vision without abnormal findings: Secondary | ICD-10-CM | POA: Diagnosis not present

## 2021-12-23 DIAGNOSIS — L570 Actinic keratosis: Secondary | ICD-10-CM | POA: Diagnosis not present

## 2021-12-23 DIAGNOSIS — Z85820 Personal history of malignant melanoma of skin: Secondary | ICD-10-CM | POA: Diagnosis not present

## 2021-12-23 DIAGNOSIS — L578 Other skin changes due to chronic exposure to nonionizing radiation: Secondary | ICD-10-CM | POA: Diagnosis not present

## 2021-12-23 DIAGNOSIS — L821 Other seborrheic keratosis: Secondary | ICD-10-CM | POA: Diagnosis not present

## 2022-02-26 DIAGNOSIS — Z0000 Encounter for general adult medical examination without abnormal findings: Secondary | ICD-10-CM | POA: Diagnosis not present

## 2022-02-26 DIAGNOSIS — M8580 Other specified disorders of bone density and structure, unspecified site: Secondary | ICD-10-CM | POA: Diagnosis not present

## 2022-02-26 DIAGNOSIS — E669 Obesity, unspecified: Secondary | ICD-10-CM | POA: Diagnosis not present

## 2022-02-26 DIAGNOSIS — R7301 Impaired fasting glucose: Secondary | ICD-10-CM | POA: Diagnosis not present

## 2022-02-26 DIAGNOSIS — E7800 Pure hypercholesterolemia, unspecified: Secondary | ICD-10-CM | POA: Diagnosis not present

## 2022-02-28 DIAGNOSIS — I081 Rheumatic disorders of both mitral and tricuspid valves: Secondary | ICD-10-CM | POA: Diagnosis not present

## 2022-02-28 DIAGNOSIS — I10 Essential (primary) hypertension: Secondary | ICD-10-CM | POA: Diagnosis not present

## 2022-02-28 DIAGNOSIS — R4789 Other speech disturbances: Secondary | ICD-10-CM | POA: Diagnosis not present

## 2022-02-28 DIAGNOSIS — I639 Cerebral infarction, unspecified: Secondary | ICD-10-CM | POA: Diagnosis not present

## 2022-02-28 DIAGNOSIS — I119 Hypertensive heart disease without heart failure: Secondary | ICD-10-CM | POA: Diagnosis not present

## 2022-02-28 DIAGNOSIS — R410 Disorientation, unspecified: Secondary | ICD-10-CM | POA: Diagnosis not present

## 2022-02-28 DIAGNOSIS — G459 Transient cerebral ischemic attack, unspecified: Secondary | ICD-10-CM | POA: Diagnosis not present

## 2022-02-28 DIAGNOSIS — R7989 Other specified abnormal findings of blood chemistry: Secondary | ICD-10-CM | POA: Diagnosis not present

## 2022-02-28 DIAGNOSIS — R479 Unspecified speech disturbances: Secondary | ICD-10-CM | POA: Diagnosis not present

## 2022-02-28 DIAGNOSIS — R4702 Dysphasia: Secondary | ICD-10-CM | POA: Diagnosis not present

## 2022-02-28 DIAGNOSIS — R2990 Unspecified symptoms and signs involving the nervous system: Secondary | ICD-10-CM | POA: Diagnosis not present

## 2022-02-28 DIAGNOSIS — R69 Illness, unspecified: Secondary | ICD-10-CM | POA: Diagnosis not present

## 2022-02-28 DIAGNOSIS — E785 Hyperlipidemia, unspecified: Secondary | ICD-10-CM | POA: Diagnosis not present

## 2022-03-01 DIAGNOSIS — I10 Essential (primary) hypertension: Secondary | ICD-10-CM | POA: Diagnosis not present

## 2022-03-01 DIAGNOSIS — I517 Cardiomegaly: Secondary | ICD-10-CM | POA: Diagnosis not present

## 2022-03-01 DIAGNOSIS — G459 Transient cerebral ischemic attack, unspecified: Secondary | ICD-10-CM | POA: Diagnosis not present

## 2022-03-01 DIAGNOSIS — Z0489 Encounter for examination and observation for other specified reasons: Secondary | ICD-10-CM | POA: Diagnosis not present

## 2022-03-01 DIAGNOSIS — E785 Hyperlipidemia, unspecified: Secondary | ICD-10-CM | POA: Diagnosis not present

## 2022-04-07 DIAGNOSIS — Z7982 Long term (current) use of aspirin: Secondary | ICD-10-CM | POA: Diagnosis not present

## 2022-04-07 DIAGNOSIS — Z8673 Personal history of transient ischemic attack (TIA), and cerebral infarction without residual deficits: Secondary | ICD-10-CM | POA: Diagnosis not present

## 2022-05-23 ENCOUNTER — Emergency Department (HOSPITAL_BASED_OUTPATIENT_CLINIC_OR_DEPARTMENT_OTHER)
Admission: EM | Admit: 2022-05-23 | Discharge: 2022-05-23 | Disposition: A | Discharge status: Home / Self Care | Payer: Medicare HMO | Attending: Emergency Medicine | Admitting: Emergency Medicine

## 2022-05-23 ENCOUNTER — Other Ambulatory Visit: Payer: Self-pay

## 2022-05-23 ENCOUNTER — Encounter (HOSPITAL_BASED_OUTPATIENT_CLINIC_OR_DEPARTMENT_OTHER): Payer: Self-pay | Admitting: Emergency Medicine

## 2022-05-23 ENCOUNTER — Emergency Department (HOSPITAL_BASED_OUTPATIENT_CLINIC_OR_DEPARTMENT_OTHER): Payer: Medicare HMO

## 2022-05-23 DIAGNOSIS — K110 Atrophy of salivary gland: Secondary | ICD-10-CM | POA: Diagnosis not present

## 2022-05-23 DIAGNOSIS — W19XXXA Unspecified fall, initial encounter: Secondary | ICD-10-CM

## 2022-05-23 DIAGNOSIS — S12101A Unspecified nondisplaced fracture of second cervical vertebra, initial encounter for closed fracture: Secondary | ICD-10-CM | POA: Diagnosis not present

## 2022-05-23 DIAGNOSIS — W2201XA Walked into wall, initial encounter: Secondary | ICD-10-CM | POA: Diagnosis not present

## 2022-05-23 DIAGNOSIS — S0003XA Contusion of scalp, initial encounter: Secondary | ICD-10-CM | POA: Diagnosis not present

## 2022-05-23 DIAGNOSIS — S0993XA Unspecified injury of face, initial encounter: Secondary | ICD-10-CM | POA: Diagnosis not present

## 2022-05-23 DIAGNOSIS — R9082 White matter disease, unspecified: Secondary | ICD-10-CM | POA: Diagnosis not present

## 2022-05-23 DIAGNOSIS — S0990XA Unspecified injury of head, initial encounter: Secondary | ICD-10-CM

## 2022-05-23 DIAGNOSIS — S199XXA Unspecified injury of neck, initial encounter: Secondary | ICD-10-CM | POA: Diagnosis not present

## 2022-05-23 DIAGNOSIS — S0083XA Contusion of other part of head, initial encounter: Secondary | ICD-10-CM | POA: Diagnosis not present

## 2022-05-23 DIAGNOSIS — M47812 Spondylosis without myelopathy or radiculopathy, cervical region: Secondary | ICD-10-CM | POA: Diagnosis not present

## 2022-05-23 DIAGNOSIS — R222 Localized swelling, mass and lump, trunk: Secondary | ICD-10-CM | POA: Diagnosis not present

## 2022-05-23 DIAGNOSIS — S0512XA Contusion of eyeball and orbital tissues, left eye, initial encounter: Secondary | ICD-10-CM | POA: Diagnosis not present

## 2022-05-23 NOTE — Discharge Instructions (Signed)
You were seen in the emergency department for evaluation of head injury after a fall.  You had a CAT scan of your head face and neck.  They did not see any obvious facial or head fracture or intracranial bleeding.  He did have a large hematoma or bruise on your forehead.  You also had a possible C2 fracture.  Neurosurgery is recommending a hard collar at all times until you follow-up with them in the next week or 2.  You can use ice to your forehead to limit the swelling.  Tylenol as needed for pain.  Return to the emergency department if any worsening or concerning symptoms

## 2022-05-23 NOTE — ED Triage Notes (Addendum)
Pt reports she fell this morning at 0900. Tripped on threshold of door and her head glanced against the wall. Large bruise noted over L eye. Not on blood thinners. Pt alert and oriented in triage. Denies any other injury

## 2022-05-23 NOTE — ED Provider Notes (Signed)
[facility] EMERGENCY DEPARTMENT Provider Note   CSN: [id number] Arrival date & time: 05/23/22  1252     History  Chief Complaint  Patient presents with   Head Injury    Brenda Lawson is a 81 y.o. female.  She has no significant past medical history.  She said she tripped going on from the garage to the kitchen and struck her head on the wall.  This occurred about 4 hours ago.  She said initially there was just a small bruise above her left eye but it is caused significant swelling since then.  No loss of consciousness no neck pain no blood thinners no other injuries.  Has tried ice without improvement.  The history is provided by the patient.  Head Injury Location:  Frontal Time since incident:  4 hours Mechanism of injury: fall   Fall:    Fall occurred:  Walking   Point of impact:  Head Pain details:    Quality:  Throbbing   Severity:  Moderate   Timing:  Constant   Progression:  Unchanged Chronicity:  New Relieved by:  Nothing Worsened by:  Nothing Ineffective treatments:  Ice Associated symptoms: no double vision, no focal weakness, no hearing loss, no loss of consciousness, no nausea, no neck pain and no vomiting        Home Medications Prior to Admission medications   Medication Sig Start Date End Date Taking? Authorizing Provider  Ascorbic Acid (VITAMIN C PO) Take 1 tablet by mouth daily.    [provider]  Multiple Vitamin (MULTIVITAMIN WITH MINERALS) TABS Take 1 tablet by mouth daily.    [provider]  Omega-3 Fatty Acids (FISH OIL PO) Take 1 capsule by mouth daily.    [provider]      Allergies    Patient has no known allergies.    Review of Systems   Review of Systems  Constitutional:  Negative for fever.  HENT:  Negative for hearing loss.   Eyes:  Negative for double vision and visual disturbance.  Gastrointestinal:  Negative for nausea and vomiting.  Musculoskeletal:  Negative for neck pain.   Neurological:  Negative for focal weakness and loss of consciousness.    Physical Exam Updated Vital Signs BP (!) 166/92   Pulse 80   Temp 98 F (36.7 C) (Oral)   Resp 16   SpO2 97%  Physical Exam Vitals and nursing note reviewed.  Constitutional:      General: She is not in acute distress.    Appearance: She is well-developed.  HENT:     Head: Normocephalic and atraumatic.     Comments: Large hematoma over left forehead causing some ecchymosis and swelling of her upper and lower left eyelids. Eyes:     Conjunctiva/sclera: Conjunctivae normal.  Cardiovascular:     Rate and Rhythm: Normal rate and regular rhythm.     Heart sounds: No murmur heard. Pulmonary:     Effort: Pulmonary effort is normal. No respiratory distress.     Breath sounds: Normal breath sounds.  Abdominal:     Palpations: Abdomen is soft.     Tenderness: There is no abdominal tenderness.  Musculoskeletal:        General: No deformity or signs of injury. Normal range of motion.     Cervical back: Neck supple. No tenderness.  Skin:    General: Skin is warm and dry.     Capillary Refill: Capillary refill takes less than 2 seconds.  Neurological:     General: No focal deficit present.     Mental Status: She is alert.      ED Results / Procedures / Treatments   Labs (all labs ordered are listed, but only abnormal results are displayed) Labs Reviewed - No data to display  EKG None  Radiology CT Maxillofacial WO CM  Result Date: 05/23/2022 CLINICAL DATA:  Facial trauma, blunt.  Fall. EXAM: CT MAXILLOFACIAL WITHOUT CONTRAST TECHNIQUE: Multidetector CT imaging of the maxillofacial structures was performed. Multiplanar CT image reconstructions were also generated. RADIATION DOSE REDUCTION: This exam was performed according to the departmental dose-optimization program which includes automated exposure control, adjustment of the mA and/or kV according to patient size and/or use of iterative reconstruction  technique. COMPARISON:  CT head without contrast CT head without contrast 02/28/2022 FINDINGS: Osseous: No acute fractures are present. Left orbit is intact. Mandible is intact and located. Orbits: Large left periorbital hematoma is present. No underlying fracture or foreign body present. No postseptal hemorrhage is present. The right orbit is within normal limits. Sinuses: The paranasal sinuses and mastoid air cells are clear. Soft tissues: No other acute soft tissue swelling hematoma is present. A 6 mm calcification the left floor mouth is likely in the left submandibular duct. Left submandibular gland is markedly atrophic or may be surgically absent. Right submandibular gland duct are within normal limits. Soft tissues of the face are otherwise within normal limits. Limited intracranial: Within normal limits. IMPRESSION: 1. Large left periorbital hematoma without underlying fracture or foreign body. 2. 6 mm calcification in the left floor mouth is likely in the left submandibular duct. 3. Left submandibular gland is markedly atrophic or may be surgically absent. These results were called by telephone at the time of interpretation on 05/23/2022 at 2:38pm to provider [facility] , who verbally acknowledged these results. Electronically Signed   By: Marin Roberts M.D.   On: 05/23/2022 14:54   CT Cervical Spine Wo Contrast  Result Date: 05/23/2022 CLINICAL DATA:  Neck trauma.  Fall. EXAM: CT CERVICAL SPINE WITHOUT CONTRAST TECHNIQUE: Multidetector CT imaging of the cervical spine was performed without intravenous contrast. Multiplanar CT image reconstructions were also generated. RADIATION DOSE REDUCTION: This exam was performed according to the departmental dose-optimization program which includes automated exposure control, adjustment of the mA and/or kV according to patient size and/or use of iterative reconstruction technique. COMPARISON:  None Available. FINDINGS: Alignment: No significant listhesis  is present. Straightening of the normal cervical lordosis is noted. Skull base and vertebrae: Craniocervical junction is within normal limits. Slight step-off noted in the anterior right lateral mass of C2 suggesting nondisplaced fracture. No additional fractures are present. Vertebral body heights are maintained. Soft tissues and spinal canal: No prevertebral fluid or swelling. No visible canal hematoma. Disc levels: Right greater than left foraminal narrowing is greatest at C4-5 due to uncovertebral and facet disease. IMPRESSION: 1. Slight step-off in the anterior right lateral mass of C2 suggesting nondisplaced fracture. 2. No additional fractures. 3. Right greater than left foraminal narrowing is greatest at C4-5 due to uncovertebral and facet disease. These results were called by telephone at the time of interpretation on 05/23/2022 at 2:38 pm to provider [facility] , who verbally acknowledged these results. Electronically Signed   By: Marin Roberts M.D.   On: 05/23/2022 14:48   CT Head Wo Contrast  Result Date: 05/23/2022 CLINICAL DATA:  Facial trauma.  Blunt. EXAM: CT HEAD WITHOUT CONTRAST TECHNIQUE: Contiguous axial images were obtained from  the base of the skull through the vertex without intravenous contrast. RADIATION DOSE REDUCTION: This exam was performed according to the departmental dose-optimization program which includes automated exposure control, adjustment of the mA and/or kV according to patient size and/or use of iterative reconstruction technique. COMPARISON:  CT head without contrast 02/28/2022 at [facility]. FINDINGS: Brain: No acute infarct, hemorrhage, or mass lesion is present. Mild periventricular white matter changes are stable. The ventricles are of normal size. No significant extraaxial fluid collection is present. The brainstem and cerebellum are within normal limits. Vascular: No hyperdense vessel or unexpected calcification. Skull: A large left supraorbital  hematoma noted. No underlying fracture or foreign body is present. Sinuses/Orbits: Postseptal left orbit is within normal limits. Right orbit is normal. The paranasal sinuses and mastoid air cells are clear. IMPRESSION: 1. Large left supraorbital hematoma without underlying fracture or foreign body. 2. Stable mild periventricular white matter disease. 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 These results were called by telephone at the time of interpretation on 05/23/2022 at 2:38 pm to provider [facility] , who verbally acknowledged these results. Electronically Signed   By: Marin Roberts M.D.   On: 05/23/2022 14:39    Procedures Procedures    Medications Ordered in ED Medications - No data to display  ED Course/ Medical Decision Making/ A&P Clinical Course as of 05/23/22 1830  Sun May 23, 2022  1443 Received a call from radiology.  He said he is concerned there may possibly be a C2 fracture.  Patient placed in hard collar.  Is not having any neck pain no neuro symptoms otherwise.  CT of head otherwise does not show any intracranial bleeding. [MB]  1456 Discussed with PA Meyran working with Dr. Yetta Barre.  She said they recommend hard collar at all times and follow-up in the office in the next week or 2 for some repeat imaging. [MB]    Clinical Course User Index [MB] Terrilee Files, MD                           Medical Decision Making Amount and/or Complexity of Data Reviewed Radiology: ordered.  This patient complains of head injury facial swelling; this involves an extensive number of treatment Options and is a complaint that carries with it a high risk of complications and morbidity. The differential includes fracture, contusion, intracranial bleed  I ordered imaging studies which included CT head max face and C-spine and I independently    visualized and interpreted imaging which showed no intracranial injury.  Possible C2 fracture. Additional  history obtained from patient's husband Previous records obtained and reviewed in epic no recent admissions I consulted Dr. Yetta Barre neurosurgery and discussed lab and imaging findings and discussed disposition.  Cardiac monitoring reviewed, normal sinus rhythm Social determinants considered, no significant barriers Critical Interventions: None  After the interventions stated above, I reevaluated the patient and found patient be neurologically intact. Admission and further testing considered, no indications for admission or further work-up at this time.  Patient placed in hard collar and given contact information for outpatient neurosurgery follow-up.  Otherwise we will continue symptomatic treatment with ice and Tylenol for pain.  Return instructions discussed          Final Clinical Impression(s) / ED Diagnoses Final diagnoses:  Fall, initial encounter  Injury of head, initial encounter  Contusion of scalp, initial encounter  Closed nondisplaced fracture of second cervical vertebra, unspecified fracture  morphology, initial encounter [facility])    Rx / DC Orders ED Discharge Orders     None         Terrilee Files, MD 05/23/22 [phone]

## 2022-06-03 DIAGNOSIS — M542 Cervicalgia: Secondary | ICD-10-CM | POA: Diagnosis not present

## 2022-06-05 DIAGNOSIS — Z6832 Body mass index (BMI) 32.0-32.9, adult: Secondary | ICD-10-CM | POA: Diagnosis not present

## 2022-06-05 DIAGNOSIS — Z803 Family history of malignant neoplasm of breast: Secondary | ICD-10-CM | POA: Diagnosis not present

## 2022-06-05 DIAGNOSIS — Z85828 Personal history of other malignant neoplasm of skin: Secondary | ICD-10-CM | POA: Diagnosis not present

## 2022-06-05 DIAGNOSIS — E669 Obesity, unspecified: Secondary | ICD-10-CM | POA: Diagnosis not present

## 2022-06-05 DIAGNOSIS — Z7982 Long term (current) use of aspirin: Secondary | ICD-10-CM | POA: Diagnosis not present

## 2022-12-29 DIAGNOSIS — Z85820 Personal history of malignant melanoma of skin: Secondary | ICD-10-CM | POA: Diagnosis not present

## 2022-12-29 DIAGNOSIS — L821 Other seborrheic keratosis: Secondary | ICD-10-CM | POA: Diagnosis not present

## 2022-12-29 DIAGNOSIS — L570 Actinic keratosis: Secondary | ICD-10-CM | POA: Diagnosis not present

## 2022-12-29 DIAGNOSIS — L578 Other skin changes due to chronic exposure to nonionizing radiation: Secondary | ICD-10-CM | POA: Diagnosis not present

## 2022-12-29 DIAGNOSIS — D1801 Hemangioma of skin and subcutaneous tissue: Secondary | ICD-10-CM | POA: Diagnosis not present

## 2023-03-10 DIAGNOSIS — Z1231 Encounter for screening mammogram for malignant neoplasm of breast: Secondary | ICD-10-CM | POA: Diagnosis not present

## 2023-03-10 DIAGNOSIS — Z780 Asymptomatic menopausal state: Secondary | ICD-10-CM | POA: Diagnosis not present

## 2023-03-10 DIAGNOSIS — M8589 Other specified disorders of bone density and structure, multiple sites: Secondary | ICD-10-CM | POA: Diagnosis not present

## 2023-03-10 DIAGNOSIS — M8580 Other specified disorders of bone density and structure, unspecified site: Secondary | ICD-10-CM | POA: Diagnosis not present

## 2023-03-11 DIAGNOSIS — M8580 Other specified disorders of bone density and structure, unspecified site: Secondary | ICD-10-CM | POA: Diagnosis not present

## 2023-03-11 DIAGNOSIS — Z9181 History of falling: Secondary | ICD-10-CM | POA: Diagnosis not present

## 2023-03-11 DIAGNOSIS — E669 Obesity, unspecified: Secondary | ICD-10-CM | POA: Diagnosis not present

## 2023-03-11 DIAGNOSIS — R7301 Impaired fasting glucose: Secondary | ICD-10-CM | POA: Diagnosis not present

## 2023-03-11 DIAGNOSIS — Z0000 Encounter for general adult medical examination without abnormal findings: Secondary | ICD-10-CM | POA: Diagnosis not present

## 2023-03-11 DIAGNOSIS — Z23 Encounter for immunization: Secondary | ICD-10-CM | POA: Diagnosis not present

## 2023-03-11 DIAGNOSIS — E7800 Pure hypercholesterolemia, unspecified: Secondary | ICD-10-CM | POA: Diagnosis not present

## 2023-07-22 DIAGNOSIS — Z008 Encounter for other general examination: Secondary | ICD-10-CM | POA: Diagnosis not present

## 2023-07-22 DIAGNOSIS — M1990 Unspecified osteoarthritis, unspecified site: Secondary | ICD-10-CM | POA: Diagnosis not present

## 2023-07-22 DIAGNOSIS — Z8249 Family history of ischemic heart disease and other diseases of the circulatory system: Secondary | ICD-10-CM | POA: Diagnosis not present

## 2023-07-22 DIAGNOSIS — Z9181 History of falling: Secondary | ICD-10-CM | POA: Diagnosis not present

## 2023-07-22 DIAGNOSIS — Z5986 Financial insecurity: Secondary | ICD-10-CM | POA: Diagnosis not present

## 2023-07-22 DIAGNOSIS — E669 Obesity, unspecified: Secondary | ICD-10-CM | POA: Diagnosis not present

## 2023-07-22 DIAGNOSIS — Z809 Family history of malignant neoplasm, unspecified: Secondary | ICD-10-CM | POA: Diagnosis not present

## 2023-07-22 DIAGNOSIS — L700 Acne vulgaris: Secondary | ICD-10-CM | POA: Diagnosis not present

## 2023-07-22 DIAGNOSIS — Z87891 Personal history of nicotine dependence: Secondary | ICD-10-CM | POA: Diagnosis not present

## 2023-07-22 DIAGNOSIS — Z818 Family history of other mental and behavioral disorders: Secondary | ICD-10-CM | POA: Diagnosis not present

## 2023-07-22 DIAGNOSIS — Z6833 Body mass index (BMI) 33.0-33.9, adult: Secondary | ICD-10-CM | POA: Diagnosis not present

## 2023-07-22 DIAGNOSIS — Z85820 Personal history of malignant melanoma of skin: Secondary | ICD-10-CM | POA: Diagnosis not present

## 2023-07-22 DIAGNOSIS — L309 Dermatitis, unspecified: Secondary | ICD-10-CM | POA: Diagnosis not present

## 2024-01-04 DIAGNOSIS — D1801 Hemangioma of skin and subcutaneous tissue: Secondary | ICD-10-CM | POA: Diagnosis not present

## 2024-01-04 DIAGNOSIS — Z85820 Personal history of malignant melanoma of skin: Secondary | ICD-10-CM | POA: Diagnosis not present

## 2024-01-04 DIAGNOSIS — L578 Other skin changes due to chronic exposure to nonionizing radiation: Secondary | ICD-10-CM | POA: Diagnosis not present

## 2024-01-04 DIAGNOSIS — L821 Other seborrheic keratosis: Secondary | ICD-10-CM | POA: Diagnosis not present

## 2024-01-04 DIAGNOSIS — L570 Actinic keratosis: Secondary | ICD-10-CM | POA: Diagnosis not present

## 2024-04-07 ENCOUNTER — Emergency Department (HOSPITAL_COMMUNITY)

## 2024-04-07 ENCOUNTER — Encounter (HOSPITAL_COMMUNITY): Payer: Self-pay

## 2024-04-07 ENCOUNTER — Other Ambulatory Visit: Payer: Self-pay

## 2024-04-07 ENCOUNTER — Emergency Department (HOSPITAL_COMMUNITY)
Admission: EM | Admit: 2024-04-07 | Discharge: 2024-04-07 | Disposition: A | Discharge status: Home / Self Care | Attending: Emergency Medicine | Admitting: Emergency Medicine

## 2024-04-07 DIAGNOSIS — M79622 Pain in left upper arm: Secondary | ICD-10-CM | POA: Diagnosis not present

## 2024-04-07 DIAGNOSIS — I10 Essential (primary) hypertension: Secondary | ICD-10-CM | POA: Diagnosis not present

## 2024-04-07 DIAGNOSIS — S42302A Unspecified fracture of shaft of humerus, left arm, initial encounter for closed fracture: Secondary | ICD-10-CM | POA: Diagnosis not present

## 2024-04-07 DIAGNOSIS — W010XXA Fall on same level from slipping, tripping and stumbling without subsequent striking against object, initial encounter: Secondary | ICD-10-CM | POA: Insufficient documentation

## 2024-04-07 DIAGNOSIS — S42202A Unspecified fracture of upper end of left humerus, initial encounter for closed fracture: Secondary | ICD-10-CM | POA: Diagnosis not present

## 2024-04-07 DIAGNOSIS — M85812 Other specified disorders of bone density and structure, left shoulder: Secondary | ICD-10-CM | POA: Diagnosis not present

## 2024-04-07 DIAGNOSIS — E7800 Pure hypercholesterolemia, unspecified: Secondary | ICD-10-CM | POA: Diagnosis not present

## 2024-04-07 DIAGNOSIS — S4992XA Unspecified injury of left shoulder and upper arm, initial encounter: Secondary | ICD-10-CM | POA: Diagnosis present

## 2024-04-07 DIAGNOSIS — S42225A 2-part nondisplaced fracture of surgical neck of left humerus, initial encounter for closed fracture: Secondary | ICD-10-CM | POA: Diagnosis not present

## 2024-04-07 DIAGNOSIS — R030 Elevated blood-pressure reading, without diagnosis of hypertension: Secondary | ICD-10-CM | POA: Insufficient documentation

## 2024-04-07 DIAGNOSIS — S43002A Unspecified subluxation of left shoulder joint, initial encounter: Secondary | ICD-10-CM | POA: Diagnosis not present

## 2024-04-07 DIAGNOSIS — E669 Obesity, unspecified: Secondary | ICD-10-CM | POA: Diagnosis not present

## 2024-04-07 MED ORDER — IBUPROFEN 200 MG PO TABS
400.0000 mg | ORAL_TABLET | Freq: Once | ORAL | Status: AC
  Administered 2024-04-07: 400 mg via ORAL
  Filled 2024-04-07: qty 2

## 2024-04-07 MED ORDER — TRAMADOL HCL 50 MG PO TABS: 50.0000 mg | ORAL_TABLET | Freq: Three times a day (TID) | ORAL | 0 refills | Status: AC | PRN

## 2024-04-07 MED ORDER — ACETAMINOPHEN 500 MG PO TABS
1000.0000 mg | ORAL_TABLET | Freq: Once | ORAL | Status: AC
  Administered 2024-04-07: 1000 mg via ORAL
  Filled 2024-04-07: qty 2

## 2024-04-07 NOTE — ED Notes (Signed)
 Pt encouraged to eat and drink. Pt given crackers and water. Pt reports she rather eat at home.

## 2024-04-07 NOTE — Progress Notes (Signed)
 Orthopedic Tech Progress Note Patient Details:  Brenda Lawson 1941/09/04 [id number]  Ortho Devices Type of Ortho Device: Shoulder immobilizer Ortho Device/Splint Location: LUE Ortho Device/Splint Interventions: Ordered, Application, Adjustment   Post Interventions Patient Tolerated: Well Instructions Provided: Care of device, Adjustment of device  Herbie Loll 04/07/2024, 5:35 PM

## 2024-04-07 NOTE — Discharge Instructions (Addendum)
 It was our pleasure to provide your ER care today - we hope that you feel better.  Wear shoulder sling/immobilizer. Follow up with orthopedist in the coming week - call office Monday AM to arrange appointment.   Take acetaminophen  or ibuprofen as need. You may also take ultram as need for pain if not controlled by acetaminophen  or ibuprofen.   Fall precautions.   Also follow up with primary care doctor in 1-2 weeks - also have your blood pressure rechecked then as it is high today.  Return to ER if worse, new symptoms, new or worsening or severe/intractable pain,numbness/weakness, chest pain, trouble breathing, or other concern.

## 2024-04-07 NOTE — ED Notes (Addendum)
 Pt walks independently at home with an assistive device. AAOx 4. Pt requested to sit at bedside to hang feet down. Call light in place, pt informed to call before getting up on her own. Non-slip socks in place.

## 2024-04-07 NOTE — ED Triage Notes (Signed)
 Pt fell in her garage yesterday and has left upper arm pain that goes into the left shoulder. Pt is unable to lift her arm.

## 2024-04-07 NOTE — ED Provider Notes (Signed)
 [place] EMERGENCY DEPARTMENT AT [facility] Provider Note   CSN: [id number] Arrival date & time: 04/07/24  1243     History  Chief Complaint  Patient presents with   Fall   Arm Pain    Brenda Lawson is a 83 y.o. female.  Pt s/p fall last evening. States slipped on wet ground. No faintness or dizziness. Did not hit head, able to get up under own power. No headache. No anticoagulant use. Post fall today c/o left shoulder and upper arm pain, dull, non radiating. No midline neck or back pain. No chest pain or sob. No abd pain or nv. No other extremity pain/injury. Skin intact. No other recent falls.  Skin intact.   The history is provided by the patient, the spouse and medical records.  Fall Pertinent negatives include no chest pain, no abdominal pain, no headaches and no shortness of breath.  Arm Pain Pertinent negatives include no chest pain, no abdominal pain, no headaches and no shortness of breath.       Home Medications Prior to Admission medications   Medication Sig Start Date End Date Taking? Authorizing Provider  Ascorbic Acid (VITAMIN C PO) Take 1 tablet by mouth daily.   Yes [provider]  Multiple Vitamins-Minerals (ZINC PO) Take 1 tablet by mouth daily.   Yes [provider]  traMADol (ULTRAM) 50 MG tablet Take 1 tablet (50 mg total) by mouth every 8 (eight) hours as needed. 04/07/24  Yes Guadalupe Lee, MD  Turmeric (QC TUMERIC COMPLEX PO) Take 1 capsule by mouth daily.   Yes [provider]  VITAMIN D PO Take 1 capsule by mouth daily.   Yes [provider]      Allergies    Patient has no known allergies.    Review of Systems   Review of Systems  Constitutional:  Negative for fever.  HENT:  Negative for nosebleeds.   Respiratory:  Negative for shortness of breath.   Cardiovascular:  Negative for chest pain.  Gastrointestinal:  Negative for abdominal pain, nausea and vomiting.  Genitourinary:  Negative for  flank pain.  Musculoskeletal:  Negative for back pain and neck pain.  Skin:  Negative for wound.  Neurological:  Negative for weakness, numbness and headaches.  Psychiatric/Behavioral:  Negative for confusion.     Physical Exam Updated Vital Signs BP (!) 186/99 (BP Location: Right Arm)   Pulse 80   Temp 98.1 F (36.7 C) (Oral)   Resp 16   SpO2 95%  Physical Exam Vitals and nursing note reviewed.  Constitutional:      Appearance: Normal appearance. She is well-developed.  HENT:     Head: Atraumatic.     Nose: Nose normal.     Mouth/Throat:     Mouth: Mucous membranes are moist.  Eyes:     General: No scleral icterus.    Extraocular Movements: Extraocular movements intact.     Conjunctiva/sclera: Conjunctivae normal.     Pupils: Pupils are equal, round, and reactive to light.  Neck:     Trachea: No tracheal deviation.  Cardiovascular:     Rate and Rhythm: Normal rate and regular rhythm.     Pulses: Normal pulses.     Heart sounds: Normal heart sounds. No murmur heard.    No friction rub. No gallop.  Pulmonary:     Effort: Pulmonary effort is normal. No respiratory distress.     Breath sounds: Normal breath sounds.  Chest:  Chest wall: No tenderness.  Abdominal:     General: There is no distension.     Palpations: Abdomen is soft.     Tenderness: There is no abdominal tenderness.  Genitourinary:    Comments:   Musculoskeletal:        General: No swelling.     Cervical back: Normal range of motion and neck supple. No rigidity. No muscular tenderness.     Comments: Mild tenderness left shoulder and upper arm. No gross sts noted. LUE is of normal color and warmth, no swelling, radial pulse 2+. No other focal bony tenderness on bilateral extremity exam. CTLS spine, non tender, aligned, no step off.   Skin:    General: Skin is warm and dry.     Findings: No rash.  Neurological:     Mental Status: She is alert.     Comments: Alert, speech normal. GCS 15. Motor/sens  grossly intact bil. LUE nvi.   Psychiatric:        Mood and Affect: Mood normal.     ED Results / Procedures / Treatments   Labs (all labs ordered are listed, but only abnormal results are displayed) Labs Reviewed - No data to display  EKG None  Radiology CT Shoulder Left Wo Contrast Result Date: 04/07/2024 CLINICAL DATA:  Shoulder trauma following fall, fracture of humerus or scapula. EXAM: CT OF THE UPPER LEFT EXTREMITY WITHOUT CONTRAST TECHNIQUE: Multidetector CT imaging of the upper left extremity was performed according to the standard protocol. RADIATION DOSE REDUCTION: This exam was performed according to the departmental dose-optimization program which includes automated exposure control, adjustment of the mA and/or kV according to patient size and/or use of iterative reconstruction technique. COMPARISON:  04/07/2024. FINDINGS: Bones/Joint/Cartilage There is a minimally displaced fracture of the humeral head/neck. No dislocation is seen. The remaining bony structures appear intact. Mild degenerative changes are noted at the acromioclavicular and glenohumeral joints Ligaments Suboptimally assessed by CT. Muscles and Tendons Mild intermuscular edema is noted at the left shoulder. No intramuscular hematoma is seen. Soft tissues No significant hematoma is seen. IMPRESSION: Nondisplaced fracture of the left humeral head/neck. Electronically Signed   By: Wyvonnia Heimlich M.D.   On: 04/07/2024 17:01   DG Humerus Left Result Date: 04/07/2024 CLINICAL DATA:  fall, pain EXAM: LEFT HUMERUS - 2+ VIEW COMPARISON:  None Available. FINDINGS: Redemonstrated curvilinear lucency along the humeral head. Otherwise, no additional humeral fracture visualized. There is no evidence of arthropathy or other focal bone abnormality. Soft tissues are unremarkable. IMPRESSION: Redemonstrated curvilinear lucency along the humeral head on the frontal radiograph. Otherwise, no additional findings worrisome for humeral  fracture. Electronically Signed   By: Rance Burrows M.D.   On: 04/07/2024 15:26   DG Shoulder Left Result Date: 04/07/2024 CLINICAL DATA:  fall, pain EXAM: LEFT SHOULDER - 2+ VIEW COMPARISON:  None Available. FINDINGS: Osteopenia.No acute, displaced fracture. Subtle curvilinear lucency along the humeral head on the frontal radiograph. The humeral head is inferiorly subluxed with respect to the glenoid. Soft tissues are unremarkable. IMPRESSION: 1. While no acute, displaced fracture was visualized, there is a subtle curvilinear lucency along the humeral head on the frontal radiograph, which may be artifactual or suggestive of a nondisplaced fracture. A follow-up CT of the shoulder may be of benefit for further characterization. 2. Inferior subluxation of the humeral head with respect to the glenoid, without definite dislocation visualized on the lateral radiograph. Clinical correlation requested. Electronically Signed   By: Jodelle Mungo.D.  On: 04/07/2024 15:25    Procedures Procedures    Medications Ordered in ED Medications  acetaminophen  (TYLENOL ) tablet 1,000 mg (1,000 mg Oral Given 04/07/24 1729)  ibuprofen (ADVIL) tablet 400 mg (400 mg Oral Given 04/07/24 1729)    ED Course/ Medical Decision Making/ A&P                                 Medical Decision Making Problems Addressed: Closed fracture of proximal end of left humerus, unspecified fracture morphology, initial encounter: acute illness or injury with systemic symptoms that poses a threat to life or bodily functions Elevated blood pressure reading: acute illness or injury Fall from slip, trip, or stumble, initial encounter: acute illness or injury with systemic symptoms that poses a threat to life or bodily functions  Amount and/or Complexity of Data Reviewed Independent Historian: spouse    Details: hx External Data Reviewed: notes. Radiology: ordered and independent interpretation performed. Decision-making details  documented in ED Course.  Risk OTC drugs. Prescription drug management.   Imaging ordered.   Reviewed nursing notes and prior charts for additional history.   No meds pta, took acetaminophen  around 8 am.    Acetaminophen  po, ibuprofen po.  Xrays reviewed/interpreted by me - no def fx, ?possible prox humerus fx.   Ct reviewed/interpreted by me - +fx. Discussed w pt.   Pt comfortable, no distress, and feels ready for d/c. Pt indicates her orthopedist is Dr Lucienne Ryder, will rec f/u there this coming week.  Return precautions provided.           Final Clinical Impression(s) / ED Diagnoses Final diagnoses:  Fall from slip, trip, or stumble, initial encounter  Closed fracture of proximal end of left humerus, unspecified fracture morphology, initial encounter  Elevated blood pressure reading    Rx / DC Orders ED Discharge Orders          Ordered    traMADol (ULTRAM) 50 MG tablet  Every 8 hours PRN        04/07/24 1736              Guiselle Mian, MD 04/07/24 1738

## 2024-04-08 DIAGNOSIS — S42292A Other displaced fracture of upper end of left humerus, initial encounter for closed fracture: Secondary | ICD-10-CM | POA: Diagnosis not present

## 2024-04-13 ENCOUNTER — Encounter: Payer: Self-pay | Admitting: Physician Assistant

## 2024-04-13 ENCOUNTER — Ambulatory Visit: Admitting: Physician Assistant

## 2024-04-13 DIAGNOSIS — S42292A Other displaced fracture of upper end of left humerus, initial encounter for closed fracture: Secondary | ICD-10-CM | POA: Diagnosis not present

## 2024-04-13 NOTE — Progress Notes (Signed)
 Office Visit Note   Patient: Brenda Lawson           Date of Birth: 1941/03/06           MRN: [id number] Visit Date: 04/13/2024              Requested by: Glena Landau, MD [address] [place],  [place] [address] PCP: Glena Landau, MD   Assessment & Plan: Visit Diagnoses:  1. Humeral head fracture, left, closed, initial encounter     Plan: Pleasant active 83 year old right-hand-dominant woman comes in today with nondisplaced humeral head fracture on the left.  This happened when she fell down.  She was immobilized in the sling.  She is actually doing quite well she has minimal bruising she has good grip strength pulses intact and she has good resistance against abduction.  I would like her to remain in the sling.  She should come out to stretch her arm down for elbow range of motion with her arm at her side.  Also should be doing some intrinsic exercises with her hand.  Her x-ray did read osteopenia.  She states she did have a bone density scan.  I did after she left got [facility]  The most recent one that I had access to was in 2024.  She has a T-score of -1.9 in her lumbar spine.  -1.2 in her left femoral neck.  Her estimated FRAX score was 25% for the next 10 years for an osteoporotic major fracture 12 for hip fracture clearly with this new fracture she is osteoporotic.  I will try and discussed this with her at the next visit  Follow-Up Instructions: Return in about 1 week (around 04/20/2024).   Orders:  No orders of the defined types were placed in this encounter.  No orders of the defined types were placed in this encounter.     Procedures: No procedures performed   Clinical Data: No additional findings.   Subjective: No chief complaint on file.   HPI patient is a pleasant 83 year old woman who presents today 1 week after a fall.  She was seen evaluated in the emergency room.  CT scan demonstrated a nondisplaced humeral head  fracture.  She is here for follow-up she is normally a patient of Dr. Arvella Bird  Review of Systems  All other systems reviewed and are negative.    Objective: Vital Signs: There were no vitals taken for this visit.  Physical Exam Constitutional:      Appearance: Normal appearance.  Pulmonary:     Effort: Pulmonary effort is normal.  Skin:    General: Skin is warm and dry.  Neurological:     General: No focal deficit present.     Mental Status: She is alert and oriented to person, place, and time.  Psychiatric:        Mood and Affect: Mood normal.        Behavior: Behavior normal.     Ortho Exam examination of her arm she has no tense edema skin skin is in good condition no ecchymosis.  She does have good abduction strength.  She is able to actively abduct her arm away from her body.  She has strong pulses grip strength is intact no paresthesias  Specialty Comments:  No specialty comments available.  Imaging: No results found.   PMFS History: Patient Active Problem List   Diagnosis Date Noted   Humeral head fracture, left, closed, initial  encounter 04/13/2024   Primary osteoarthritis of right knee 10/29/2019   Chronic eczematous otitis externa of both ears 01/19/2017   Chronic seasonal allergic rhinitis 01/19/2017   Colon polyp 04/25/2015   History reviewed. No pertinent past medical history.  History reviewed. No pertinent family history.  History reviewed. No pertinent surgical history. Social History   Occupational History   Not on file  Tobacco Use   Smoking status: Never   Smokeless tobacco: Never  Substance and Sexual Activity   Alcohol use: Yes   Drug use: No   Sexual activity: Not on file

## 2024-04-20 ENCOUNTER — Other Ambulatory Visit (INDEPENDENT_AMBULATORY_CARE_PROVIDER_SITE_OTHER): Payer: Self-pay

## 2024-04-20 ENCOUNTER — Ambulatory Visit: Admitting: Physician Assistant

## 2024-04-20 ENCOUNTER — Encounter: Payer: Self-pay | Admitting: Physician Assistant

## 2024-04-20 DIAGNOSIS — S42292A Other displaced fracture of upper end of left humerus, initial encounter for closed fracture: Secondary | ICD-10-CM | POA: Diagnosis not present

## 2024-04-20 NOTE — Progress Notes (Signed)
 Office Visit Note   Patient: Brenda Lawson           Date of Birth: April 15, 1941           MRN: [id number] Visit Date: 04/20/2024              Requested by: Glena Landau, MD [address] [place],  [place] [address] PCP: Glena Landau, MD  No chief complaint on file.     HPI: Brenda Lawson is a pleasant 83 year old woman who comes in today 2 weeks status post left humeral head fracture nondisplaced.  She is doing well in her splint.  No complaints  Assessment & Plan: Visit Diagnoses:  1. Humeral head fracture, left, closed, initial encounter     Plan: She is doing quite well.  I have instructed her in pendulum exercises today.  Will get x-rays at the next visit.  Also will have her blocked off for 45 minutes so we can combine this with osteoporosis clinic  Follow-Up Instructions: Return in about 2 weeks (around 05/04/2024).   Ortho Exam  Patient is alert, oriented, no adenopathy, well-dressed, normal affect, normal respiratory effort. Examination of her left shoulder she is neurovascular intact she has good flexion extension of her elbow does have some tenderness to palpation of the glenohumeral joint.  Distal pulses are intact she is able to abduct her arm    Imaging: XR Shoulder Left Result Date: 04/20/2024 To the radial's of the left shoulder obtained today.  No evidence of dislocation no evidence of displacement of the fracture  No images are attached to the encounter.  Labs: Lab Results  Component Value Date   REPTSTATUS 01/14/2013 FINAL 01/12/2013   CULT  01/12/2013    Multiple bacterial morphotypes present, none predominant. Suggest appropriate recollection if clinically indicated.     No results found for: ALBUMIN, PREALBUMIN, CBC  No results found for: MG No results found for: VD25OH  No results found for: PREALBUMIN    Latest Ref Rng & Units 01/12/2013    8:25 PM  CBC EXTENDED  WBC 4.0 - 10.5 K/uL 5.2   RBC 3.87 - 5.11 MIL/uL  4.01   Hemoglobin 12.0 - 15.0 g/dL 56.2   HCT 13.0 - 86.5 % 37.7   Platelets 150 - 400 K/uL 223   NEUT# 1.7 - 7.7 K/uL 2.5   Lymph# 0.7 - 4.0 K/uL 2.1      There is no height or weight on file to calculate BMI.  Orders:  Orders Placed This Encounter  Procedures   XR Shoulder Left   No orders of the defined types were placed in this encounter.    Procedures: No procedures performed  Clinical Data: No additional findings.  ROS:  All other systems negative, except as noted in the HPI. Review of Systems  Objective: Vital Signs: There were no vitals taken for this visit.  Specialty Comments:  No specialty comments available.  PMFS History: Patient Active Problem List   Diagnosis Date Noted   Humeral head fracture, left, closed, initial encounter 04/13/2024   Primary osteoarthritis of right knee 10/29/2019   Chronic eczematous otitis externa of both ears 01/19/2017   Chronic seasonal allergic rhinitis 01/19/2017   Colon polyp 04/25/2015   History reviewed. No pertinent past medical history.  History reviewed. No pertinent family history.  History reviewed. No pertinent surgical history. Social History   Occupational History   Not on file  Tobacco Use   Smoking status: Never  Smokeless tobacco: Never  Substance and Sexual Activity   Alcohol use: Yes   Drug use: No   Sexual activity: Not on file

## 2024-05-04 ENCOUNTER — Other Ambulatory Visit: Payer: Self-pay | Admitting: Physician Assistant

## 2024-05-04 ENCOUNTER — Other Ambulatory Visit (INDEPENDENT_AMBULATORY_CARE_PROVIDER_SITE_OTHER): Payer: Self-pay

## 2024-05-04 ENCOUNTER — Ambulatory Visit: Admitting: Physician Assistant

## 2024-05-04 ENCOUNTER — Encounter: Payer: Self-pay | Admitting: Physician Assistant

## 2024-05-04 DIAGNOSIS — M8000XA Age-related osteoporosis with current pathological fracture, unspecified site, initial encounter for fracture: Secondary | ICD-10-CM

## 2024-05-04 NOTE — Progress Notes (Signed)
 Office Visit Note   Patient: Brenda Lawson           Date of Birth: [birth year]           MRN: [id number] Visit Date: 05/04/2024              Requested by: Loreli Kins, MD [address] [place],  [place] [address] PCP: Loreli Kins, MD   Assessment & Plan: Visit Diagnoses:  1. Age-related osteoporosis with current pathological fracture, initial encounter     Plan: Patient is an 83 year old woman who I have seen for a humeral head nondisplaced fracture.  She is now 4 weeks since the injury with regards to that she is doing very well.  I have recommended from physical therapy but not to do any weight or lifting for 2 weeks.  We also had a long discussion with regards to osteoporosis.  She has not been treated for osteoporosis before.  She does say that maybe she took a pill once but it caused her to have significant muscle pain.  She has apparent humeral head fracture.  She has no cardiac history no history of cancer.  She has no history of kidney disease and her kidney function is good.  He has no history of ulcers or reflux.  She went through menopause at 40 without any hormone supplementation.  She thinks she probably gets calcium in her multivitamin maybe about 500 mg she takes 1000 international units of vitamin D daily but has not been checked recently.  She was a social smoker but many years ago.  Does not smoke currently she has maybe 1 alcoholic beverage a month.  She enjoys walking twice a week.  She has not had any major dental work she does have a history of a hip fracture in her mom.  I spent 45 minutes reviewing her chart talking to her about lifestyle changes as well as medications including side effects and risks.  Her FRAX score with adding the fact that she has a history of a familiar fracture hip fracture is now raised to 22% hip fracture risk and 36% major osteoporotic fracture in the next 10 years.  Again she did try Fosamax but had difficulty tolerating it.  I  think she would be a good candidate for Prolia.  I given her information about this she will contact me with regards to her decision.  Would also like to see her back in a month to reexamine her shoulder  Follow-Up Instructions: No follow-ups on file.   Orders:  Orders Placed This Encounter  Procedures   XR Shoulder Left   No orders of the defined types were placed in this encounter.     Procedures: No procedures performed   Clinical Data: No additional findings.   Subjective: No chief complaint on file.   HPI Aldonia is a pleasant active 83 year old woman who comes in here for follow-up on her humeral head fracture on the left.  She says she is doing pretty good she starting get a little bit of motion but is still sore.  Secondly I asked her to come in for an osteoporosis visit.  Referred by myself  Review of Systems  All other systems reviewed and are negative.    Objective: Vital Signs: There were no vitals taken for this visit.  Physical Exam Constitutional:      Appearance: Normal appearance.  Pulmonary:     Effort: Pulmonary effort is normal.   Skin:  General: Skin is warm and dry.   Neurological:     General: No focal deficit present.     Mental Status: She is alert and oriented to person, place, and time.   Psychiatric:        Mood and Affect: Mood normal.        Behavior: Behavior normal.       Specialty Comments:  No specialty comments available.  Imaging: No results found.   PMFS History: Patient Active Problem List   Diagnosis Date Noted   Osteoporosis with current pathological fracture 05/04/2024   Humeral head fracture, left, closed, initial encounter 04/13/2024   Primary osteoarthritis of right knee 10/29/2019   Chronic eczematous otitis externa of both ears 01/19/2017   Chronic seasonal allergic rhinitis 01/19/2017   Colon polyp 04/25/2015   No past medical history on file.  No family history on file.  No past surgical  history on file. Social History   Occupational History   Not on file  Tobacco Use   Smoking status: Never   Smokeless tobacco: Never  Substance and Sexual Activity   Alcohol use: Yes   Drug use: No   Sexual activity: Not on file

## 2024-05-04 NOTE — Addendum Note (Signed)
 Addended by: RODGERS LACY on: 05/04/2024 11:34 AM   Modules accepted: Orders

## 2024-05-07 DIAGNOSIS — E669 Obesity, unspecified: Secondary | ICD-10-CM | POA: Diagnosis not present

## 2024-05-07 DIAGNOSIS — E7800 Pure hypercholesterolemia, unspecified: Secondary | ICD-10-CM | POA: Diagnosis not present

## 2024-05-08 ENCOUNTER — Telehealth: Payer: Self-pay | Admitting: Physician Assistant

## 2024-05-08 LAB — HOUSE ACCOUNT TRACKING

## 2024-05-08 LAB — VITAMIN D 25 HYDROXY (VIT D DEFICIENCY, FRACTURES): Vit D, 25-Hydroxy: 43 ng/mL (ref 30–100)

## 2024-05-08 NOTE — Telephone Encounter (Signed)
 Pt called requesting a call from Ronal Caldron. Pt states Ronal Caldron was yo send referral for physical therapy . Please call pt about this matter at [phone].

## 2024-05-09 ENCOUNTER — Telehealth: Payer: Self-pay | Admitting: Physician Assistant

## 2024-05-09 NOTE — Telephone Encounter (Signed)
 Pt called requesting a call from Ronal Caldron. Pt states Ronal Caldron was yo send referral for physical therapy . Please call pt about this matter at [phone].

## 2024-05-14 ENCOUNTER — Other Ambulatory Visit: Payer: Self-pay

## 2024-05-14 ENCOUNTER — Telehealth: Payer: Self-pay

## 2024-05-14 DIAGNOSIS — G8929 Other chronic pain: Secondary | ICD-10-CM

## 2024-05-23 DIAGNOSIS — H52223 Regular astigmatism, bilateral: Secondary | ICD-10-CM | POA: Diagnosis not present

## 2024-05-23 DIAGNOSIS — H5203 Hypermetropia, bilateral: Secondary | ICD-10-CM | POA: Diagnosis not present

## 2024-05-23 DIAGNOSIS — H524 Presbyopia: Secondary | ICD-10-CM | POA: Diagnosis not present

## 2024-05-24 ENCOUNTER — Telehealth: Payer: Self-pay

## 2024-05-24 DIAGNOSIS — G8929 Other chronic pain: Secondary | ICD-10-CM

## 2024-05-24 DIAGNOSIS — S42292A Other displaced fracture of upper end of left humerus, initial encounter for closed fracture: Secondary | ICD-10-CM

## 2024-06-07 DIAGNOSIS — E669 Obesity, unspecified: Secondary | ICD-10-CM | POA: Diagnosis not present

## 2024-06-07 DIAGNOSIS — E7800 Pure hypercholesterolemia, unspecified: Secondary | ICD-10-CM | POA: Diagnosis not present

## 2024-06-08 ENCOUNTER — Ambulatory Visit: Admitting: Physician Assistant

## 2024-06-18 NOTE — Telephone Encounter (Signed)
 DONE

## 2024-06-18 NOTE — Telephone Encounter (Signed)
 COMPLERE

## 2024-07-02 DIAGNOSIS — Z0100 Encounter for examination of eyes and vision without abnormal findings: Secondary | ICD-10-CM | POA: Diagnosis not present

## 2024-07-08 DIAGNOSIS — E669 Obesity, unspecified: Secondary | ICD-10-CM | POA: Diagnosis not present

## 2024-07-08 DIAGNOSIS — E7800 Pure hypercholesterolemia, unspecified: Secondary | ICD-10-CM | POA: Diagnosis not present

## 2024-08-07 DIAGNOSIS — E7800 Pure hypercholesterolemia, unspecified: Secondary | ICD-10-CM | POA: Diagnosis not present

## 2024-08-07 DIAGNOSIS — E669 Obesity, unspecified: Secondary | ICD-10-CM | POA: Diagnosis not present

## 2024-09-10 ENCOUNTER — Encounter: Payer: Self-pay | Admitting: Radiology
